# Patient Record
Sex: Female | Born: 1950 | Race: Black or African American | Hispanic: No | Marital: Married | Smoking: Never smoker
Health system: Southern US, Community
[De-identification: ages and names within clinical notes are randomized; demographics above are authoritative.]

## PROBLEM LIST (undated history)

## (undated) DIAGNOSIS — I951 Orthostatic hypotension: Secondary | ICD-10-CM

## (undated) DIAGNOSIS — I498 Other specified cardiac arrhythmias: Secondary | ICD-10-CM

## (undated) DIAGNOSIS — K219 Gastro-esophageal reflux disease without esophagitis: Secondary | ICD-10-CM

## (undated) DIAGNOSIS — R000 Tachycardia, unspecified: Secondary | ICD-10-CM

## (undated) DIAGNOSIS — D649 Anemia, unspecified: Secondary | ICD-10-CM

## (undated) DIAGNOSIS — R519 Headache, unspecified: Secondary | ICD-10-CM

## (undated) DIAGNOSIS — I10 Essential (primary) hypertension: Secondary | ICD-10-CM

## (undated) DIAGNOSIS — G90A Postural orthostatic tachycardia syndrome (POTS): Secondary | ICD-10-CM

## (undated) HISTORY — DX: Gastro-esophageal reflux disease without esophagitis: K21.9

## (undated) HISTORY — DX: Other specified cardiac arrhythmias: I49.8

## (undated) HISTORY — DX: Tachycardia, unspecified: R00.0

## (undated) HISTORY — DX: Orthostatic hypotension: I95.1

## (undated) HISTORY — PX: UTERINE FIBROID SURGERY: SHX826

## (undated) HISTORY — DX: Postural orthostatic tachycardia syndrome (POTS): G90.A

## (undated) HISTORY — DX: Headache, unspecified: R51.9

## (undated) HISTORY — PX: TUBAL LIGATION: SHX77

## (undated) HISTORY — DX: Anemia, unspecified: D64.9

## (undated) HISTORY — DX: Essential (primary) hypertension: I10

---

## 1996-01-18 HISTORY — PX: APPENDECTOMY: SHX54

## 1997-04-17 ENCOUNTER — Inpatient Hospital Stay (HOSPITAL_COMMUNITY): Admission: AD | Admit: 1997-04-17 | Discharge: 1997-04-20 | Payer: Self-pay | Admitting: Internal Medicine

## 1999-01-28 ENCOUNTER — Other Ambulatory Visit: Admission: RE | Admit: 1999-01-28 | Discharge: 1999-01-28 | Payer: Self-pay | Admitting: Obstetrics & Gynecology

## 2000-04-06 ENCOUNTER — Other Ambulatory Visit: Admission: RE | Admit: 2000-04-06 | Discharge: 2000-04-06 | Payer: Self-pay | Admitting: Obstetrics & Gynecology

## 2000-08-21 ENCOUNTER — Other Ambulatory Visit: Admission: RE | Admit: 2000-08-21 | Discharge: 2000-08-21 | Payer: Self-pay | Admitting: Obstetrics & Gynecology

## 2000-08-31 ENCOUNTER — Ambulatory Visit (HOSPITAL_COMMUNITY): Admission: RE | Admit: 2000-08-31 | Discharge: 2000-08-31 | Payer: Self-pay | Admitting: Obstetrics & Gynecology

## 2001-08-21 ENCOUNTER — Ambulatory Visit (HOSPITAL_COMMUNITY): Admission: RE | Admit: 2001-08-21 | Discharge: 2001-08-21 | Payer: Self-pay | Admitting: Internal Medicine

## 2001-08-21 ENCOUNTER — Encounter: Payer: Self-pay | Admitting: Internal Medicine

## 2001-09-13 ENCOUNTER — Other Ambulatory Visit: Admission: RE | Admit: 2001-09-13 | Discharge: 2001-09-13 | Payer: Self-pay | Admitting: Obstetrics & Gynecology

## 2002-06-18 ENCOUNTER — Encounter: Payer: Self-pay | Admitting: Internal Medicine

## 2002-06-18 ENCOUNTER — Encounter: Admission: RE | Admit: 2002-06-18 | Discharge: 2002-06-18 | Payer: Self-pay | Admitting: Internal Medicine

## 2003-01-21 ENCOUNTER — Other Ambulatory Visit: Admission: RE | Admit: 2003-01-21 | Discharge: 2003-01-21 | Payer: Self-pay | Admitting: Obstetrics & Gynecology

## 2003-07-10 ENCOUNTER — Ambulatory Visit (HOSPITAL_COMMUNITY): Admission: RE | Admit: 2003-07-10 | Discharge: 2003-07-10 | Payer: Self-pay | Admitting: Surgery

## 2003-07-10 ENCOUNTER — Encounter (INDEPENDENT_AMBULATORY_CARE_PROVIDER_SITE_OTHER): Payer: Self-pay | Admitting: Specialist

## 2003-07-10 ENCOUNTER — Ambulatory Visit (HOSPITAL_BASED_OUTPATIENT_CLINIC_OR_DEPARTMENT_OTHER): Admission: RE | Admit: 2003-07-10 | Discharge: 2003-07-10 | Payer: Self-pay | Admitting: Surgery

## 2004-02-18 ENCOUNTER — Other Ambulatory Visit: Admission: RE | Admit: 2004-02-18 | Discharge: 2004-02-18 | Payer: Self-pay | Admitting: Obstetrics & Gynecology

## 2005-02-24 ENCOUNTER — Other Ambulatory Visit: Admission: RE | Admit: 2005-02-24 | Discharge: 2005-02-24 | Payer: Self-pay | Admitting: Obstetrics & Gynecology

## 2006-04-17 ENCOUNTER — Emergency Department (HOSPITAL_COMMUNITY): Admission: EM | Admit: 2006-04-17 | Discharge: 2006-04-17 | Payer: Self-pay | Admitting: Family Medicine

## 2009-03-14 ENCOUNTER — Observation Stay (HOSPITAL_COMMUNITY): Admission: EM | Admit: 2009-03-14 | Discharge: 2009-03-15 | Payer: Self-pay | Admitting: Emergency Medicine

## 2009-03-14 ENCOUNTER — Encounter (INDEPENDENT_AMBULATORY_CARE_PROVIDER_SITE_OTHER): Payer: Self-pay | Admitting: Internal Medicine

## 2009-03-14 ENCOUNTER — Ambulatory Visit: Payer: Self-pay | Admitting: Internal Medicine

## 2009-03-16 ENCOUNTER — Telehealth: Payer: Self-pay | Admitting: Internal Medicine

## 2009-04-20 ENCOUNTER — Ambulatory Visit: Payer: Self-pay | Admitting: Internal Medicine

## 2009-04-20 DIAGNOSIS — R9431 Abnormal electrocardiogram [ECG] [EKG]: Secondary | ICD-10-CM

## 2009-04-20 DIAGNOSIS — R609 Edema, unspecified: Secondary | ICD-10-CM

## 2009-04-20 DIAGNOSIS — R000 Tachycardia, unspecified: Secondary | ICD-10-CM

## 2009-04-20 DIAGNOSIS — I10 Essential (primary) hypertension: Secondary | ICD-10-CM

## 2009-05-06 ENCOUNTER — Encounter (INDEPENDENT_AMBULATORY_CARE_PROVIDER_SITE_OTHER): Payer: Self-pay | Admitting: *Deleted

## 2009-05-06 ENCOUNTER — Ambulatory Visit: Payer: Self-pay | Admitting: Cardiology

## 2009-05-06 ENCOUNTER — Ambulatory Visit: Payer: Self-pay

## 2009-05-06 ENCOUNTER — Encounter: Payer: Self-pay | Admitting: Internal Medicine

## 2009-05-06 ENCOUNTER — Ambulatory Visit (HOSPITAL_COMMUNITY): Admission: RE | Admit: 2009-05-06 | Discharge: 2009-05-06 | Payer: Self-pay | Admitting: Internal Medicine

## 2009-05-07 ENCOUNTER — Telehealth: Payer: Self-pay | Admitting: Internal Medicine

## 2009-05-21 ENCOUNTER — Ambulatory Visit: Payer: Self-pay | Admitting: Internal Medicine

## 2009-05-26 ENCOUNTER — Telehealth (INDEPENDENT_AMBULATORY_CARE_PROVIDER_SITE_OTHER): Payer: Self-pay | Admitting: *Deleted

## 2009-05-26 ENCOUNTER — Encounter: Payer: Self-pay | Admitting: Internal Medicine

## 2009-05-30 ENCOUNTER — Emergency Department (HOSPITAL_BASED_OUTPATIENT_CLINIC_OR_DEPARTMENT_OTHER): Admission: EM | Admit: 2009-05-30 | Discharge: 2009-05-30 | Payer: Self-pay | Admitting: Emergency Medicine

## 2009-05-31 ENCOUNTER — Emergency Department (HOSPITAL_COMMUNITY): Admission: EM | Admit: 2009-05-31 | Discharge: 2009-05-31 | Payer: Self-pay | Admitting: Infectious Diseases

## 2009-05-31 ENCOUNTER — Ambulatory Visit: Payer: Self-pay | Admitting: Diagnostic Radiology

## 2009-05-31 ENCOUNTER — Emergency Department (HOSPITAL_BASED_OUTPATIENT_CLINIC_OR_DEPARTMENT_OTHER): Admission: EM | Admit: 2009-05-31 | Discharge: 2009-05-31 | Payer: Self-pay | Admitting: Emergency Medicine

## 2009-06-03 ENCOUNTER — Ambulatory Visit (HOSPITAL_BASED_OUTPATIENT_CLINIC_OR_DEPARTMENT_OTHER): Admission: RE | Admit: 2009-06-03 | Discharge: 2009-06-03 | Payer: Self-pay | Admitting: Emergency Medicine

## 2009-06-03 ENCOUNTER — Ambulatory Visit: Payer: Self-pay | Admitting: Diagnostic Radiology

## 2009-07-02 ENCOUNTER — Telehealth (INDEPENDENT_AMBULATORY_CARE_PROVIDER_SITE_OTHER): Payer: Self-pay | Admitting: *Deleted

## 2009-12-03 ENCOUNTER — Telehealth: Payer: Self-pay | Admitting: Internal Medicine

## 2010-02-07 ENCOUNTER — Encounter: Payer: Self-pay | Admitting: Obstetrics & Gynecology

## 2010-02-16 NOTE — Progress Notes (Signed)
Summary: surgical clearance  Phone Note Call from Patient Call back at [phone]   Caller: Patient Reason for Call: Talk to Nurse Summary of Call: needs letter faxed to Dr Whitman [facility], stating pt is cleared for wrist surgery. Please fax to Venia Minks [phone] Initial call taken by: Migdalia Dk,  May 26, 2009 11:46 AM  Follow-up for Phone Call        Per Dr Odessa Fleming note, patient cleared for surgery, letter faxed. Gypsy Balsam RN BSN  May 26, 2009 11:59 AM

## 2010-02-16 NOTE — Progress Notes (Signed)
Summary: echo results  Phone Note Outgoing Call Call back at [facility] Phone [phone]   Call placed by: Gypsy Balsam RN BSN,  May 07, 2009 6:11 PM Summary of Call: Called patient and left message on machine to call about echo results. Gypsy Balsam RN BSN  May 07, 2009 6:11 PM   Follow-up for Phone Call        returning call, Migdalia Dk  May 11, 2009 8:12 AM   Additional Follow-up for Phone Call Additional follow up Details #1::        Pt notified. Gypsy Balsam RN BSN  May 11, 2009 10:15 AM

## 2010-02-16 NOTE — Letter (Signed)
Summary: Clearance Letter  [employer], Main Office  [address]   [place], [place] [address]   Phone: [phone]  Fax: [phone]    May 26, 2009  Re:     Barbara Callahan Address:   [address]     [place], [place]  [address] DOB:     [birth year] MRN:     [id number]     Patient is at acceptable cardiac risk for wrist surgery.  Please call with questions.         Sincerely,  Sherryl Manges, MD, [facility] Gypsy Balsam RN BSN

## 2010-02-16 NOTE — Progress Notes (Signed)
Summary: [facility]  Phone Note Call from Patient Call back at [facility] Phone [phone]   Caller: Patient [phone] Summary of Call: PT ON A BETA BLOCKER-CAN SHE TAKE ZUMBA CLASSES? Initial call taken by: Glynda Jaeger,  July 02, 2009 3:43 PM  Follow-up for Phone Call        OK for zumba class, pt aware Follow-up by: Charolotte Capuchin, RN,  July 02, 2009 3:53 PM

## 2010-02-16 NOTE — Assessment & Plan Note (Signed)
Summary: appt @ 11:00/eph   History of Present Illness: Barbara Callahan is seen following hospitalizations for chest pain tachycardia palpitations. It is my presumption at that time that she probably had postural orthostatic tachycardia i.e. POTS because he had significant change in her heart is in the 80s to the 120s with standing that resolved with lying down. Her evaluation for secondary tachycardia included TSH that was borderline normal low, a CBC that was normal and cardiac evaluation done at [facility] that apparently included a normal echo and a normal stress test. She also been on a diuretic which he discontinued; amlodipine for her blood pressure issues was maintained.  She comes in today with worsening peripheral edema. She's also had problems with orthostatic stress intolerance i.e. dizziness and shortness of breath  Current Medications (verified): 1)  Metoprolol Succinate 100 Mg Xr24h-Tab (Metoprolol Succinate) .... Take One Tablet By Mouth Daily 2)  Aciphex 20 Mg Tbec (Rabeprazole Sodium) .... Take One Tablet Once Daily 3)  Ferrous Sulfate 325 (65 Fe) Mg Tabs (Ferrous Sulfate) .... Once Daily 4)  Cyclobenzaprine Hcl 10 Mg Tabs (Cyclobenzaprine Hcl) .... Take One Tablet As Needed Once Daily  Allergies (verified): No Known Drug Allergies  Past History:  Past Medical History: Last updated: May 01, 2009 Chest pain.  Atypical.  Noncardiac. Hypertension Gastroesophageal reflux disease Anemia Hypokalemia New diagnosis postural tachycardia syndrome, POTS  Past Surgical History: Last updated: May 01, 2009 Tubal ligation Appendectomy  Family History: Last updated: 05/01/09 Father died of a heart attack in his 54s and was a  nonsmoker.   Mother died at age 59 of breast cancer and was a nonsmoker.  Grandmother had pancreatic cancer, mother's side.   She has 2 brothers and 3 sisters all of whom are healthy.      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  Alcohol Use - no  Vital Signs:  Patient profile:   60 year old female Height:      63 inches Weight:      160 pounds BMI:     28.45 Pulse rate:   121 / minute Pulse (ortho):   119 / minute Pulse rhythm:   regular BP sitting:   146 / 90  (left arm) BP standing:   148 / 84 Cuff size:   large  Vitals Entered By: Judithe Modest CMA (April 20, 2009 11:11 AM)  Serial Vital Signs/Assessments:  Time      Position  BP       Pulse  Resp  Temp     By 11:16 AM  Lying RA  125/82   110                   Amanda Trulove CMA 11:16 AM  Sitting   134/89   115                   Amanda Trulove CMA 11:16 AM  Standing  148/84   119                   Amanda Trulove CMA  Comments: 11:16 AM 2 minutes-125/86 HR 118 5 minutes-139/83 HR 121  Pt reports dizziness and loss of balance.  Pt also reporting hot flashes and blurry vision.   By: Judithe Modest CMA    Physical Exam  General:  The patient was alert and oriented in no acute distress. HEENT Normal.  Neck veins were flat, carotids were brisk.  Lungs were  clear.  Heart sounds were regular without murmurs or gallops.  Abdomen was soft with active bowel sounds. There is no clubbing cyanosis or edema. Skin Warm and dry    EKG  Procedure date:  04/20/2009  Findings:      sinus rhythm at 105 with the intervals 0.15/2008/0.33 Axis is 64 Biatrial enlargement  Impression & Recommendations:  Problem # 1:  UNSPECIFIED TACHYCARDIA (ICD-785.0) Barbara. Callahan has what appears to be sinus tachycardia. It could be atrial tachycardia although the origin would be quite close to the sinus node. The fact that she has an abnormal cardiogram suggestive of biatrial margin and makes me wonder whether or there may not be an underlying cardiac pathology giving rise to the tachycardia secondary fashion. To do this, I would suggest that we repeat her echo. She is agreeable. There may be a role for MRI scanning in the event  that the other information is not illuminating Orders: Echocardiogram (Echo)  Problem # 2:  EDEMA (ICD-782.3) the edema may be related to her salt retention that was worse since we stopped her diuretic or more hopefully as a consequence of her amlodipine. We will plan to stop her amlodipine and see how it does. She is advised to decrease her salt intake.  Problem # 3:  HYPERTENSION, BENIGN (ICD-401.1) given the drug adjustments above we will plan to begin her on Avapro. Will check her kidneys in 2 weeks. We'll also increase her metoprolol from 50-100 mg a day.  Problem # 4:  ABNORMAL ELECTROCARDIOGRAM (ICD-794.31) biatrial enlargement will be evaluated as above  Patient Instructions: 1)  Your physician has recommended you make the following change in your medication: Increase your Metoprolol to 100mg  daily.  Stop your Amlodipine.  Start Avapro 150mg  daily.  2)  Your physician has requested that you have an echocardiogram.  Echocardiography is a painless test that uses sound waves to create images of your heart. It provides your doctor with information about the size and shape of your heart and how well your heart's chambers and valves are working.  This procedure takes approximately one hour. There are no restrictions for this procedure. 3)  Your physician recommends that you return for lab work in: 2 weeks with echo (BMP dx 785.0) 4)  Your physician recommends that you schedule a follow-up appointment in: 1 month with Dr Graciela Husbands Prescriptions: AVAPRO 150 MG TABS (IRBESARTAN) Take 1 tablet by mouth once a day  #30 x 11   Entered by:   Optometrist BSN   Authorized by:   Nathen May, MD, [facility]   Signed by:   Gypsy Balsam RN BSN on 04/20/2009   Method used:   Electronically to        [facility] [phone]* (retail)       [address]       [place], [place]  [address]       Ph: [phone] or [phone]       Fax: [phone]   RxID:    [id number] METOPROLOL SUCCINATE 100 MG XR24H-TAB (METOPROLOL SUCCINATE) Take one tablet by mouth daily  #30 x 11   Entered by:   Optometrist BSN   Authorized by:   Nathen May, MD, [facility]   Signed by:   Gypsy Balsam RN BSN on 04/20/2009   Method used:   Electronically to        [facility] #  [id number]* (retail)       [address]       [place], [place]  [address]       Ph: [phone] or [phone]       Fax: [phone]   RxID:   [id number]

## 2010-02-16 NOTE — Progress Notes (Signed)
Summary: meds  Phone Note Call from Patient Call back at Home Phone [phone]   Caller: Patient Summary of Call: pt was only given 3 atenolol 50mg  was told to take it for 2 weeks.Thinks that the script was written wrong. Please call pharmacy for the rest of the script. [facility] [phone] Initial call taken by: Edman Circle,  March 16, 2009 12:46 PM  Follow-up for Phone Call        RX written by Bailey Mech at hospital d/c. She was to do the beta blocker challenge per Dr. Graciela Husbands. RX was only written for 3 pills instead of 30 tabs. I verified this by E-Chart and new RX was sent to pharmacy.  Follow-up by: Duncan Dull, RN, BSN,  March 16, 2009 2:21 PM    New/Updated Medications: ATENOLOL 50 MG TABS (ATENOLOL) once daily Prescriptions: ATENOLOL 50 MG TABS (ATENOLOL) once daily  #30 x 0   Entered by:   Duncan Dull, RN, BSN   Authorized by:   Nathen May, MD, [facility]   Signed by:   Duncan Dull, RN, BSN on 03/16/2009   Method used:   Electronically to        [facility] # [phone]* (retail)       [address] [place], [place]  [address]       Ph: [phone]       Fax: [phone]   RxID:   [id number]

## 2010-02-16 NOTE — Letter (Signed)
Summary: [facility] - Outpatient Coins[facility] Notice  [facility] - Outpatient Coins[facility] Notice   Imported By: Marylou Mccoy 05/07/2009 12:31:14  _____________________________________________________________________  External Attachment:    Type:   Image     Comment:   External Document

## 2010-02-16 NOTE — Progress Notes (Signed)
Summary: c/o poor circulation in legs (no answer @ 12pm)  Phone Note Call from Patient Call back at [facility] Phone [phone]   Caller: Patient [phone] Reason for Call: Talk to Nurse Details for Reason: c/o poor circulation in legs.  Initial call taken by: Lorne Skeens,  December 03, 2009 11:51 AM  Follow-up for Phone Call        I attempted to call the pt at the contact # that she left. Someone answered that it was a nutrition center and they attempted to get the pt to the phone. When attempting to be transferred, the phone rang continuosly with no answer. I will attempt to call back later. Sherri Rad, RN, BSN  December 03, 2009 12:00P  Pt return call  [phone] Barbara Callahan  December 03, 2009 3:33 PM  Additional Follow-up for Phone Call Additional follow up Details #1::        talked with pt--pt states she has a constant ache  in her left leg from her thigh down to her toes--she states it started about 1 week ago, she denies any numbness in her feet or toes,she denies any edema, pt denies any injury to her leg--she states that it is a generalized ache and not a local pain--pt to call her PCP for followup and recommendations

## 2010-02-16 NOTE — Assessment & Plan Note (Signed)
Summary: 1 month rov/sl   Visit Type:  Follow-up   History of Present Illness: Barbara Callahan is seen following hospitalizations for chest pain tachycardia palpitations. It is my presumption at that time that she probably had postural orthostatic tachycardia i.e. POTS because he had significant change in her heart is in the 80s to the 120s with standing that resolved with lying down. Her evaluation for secondary tachycardia included TSH that was borderline normal low, a CBC that was normal and cardiac evaluation done at [facility] that apparently included a normal echo and a normal stress test.   Pravachol was recently repeated at her office that was normal.  We saw her last time we increased her beta blocker which is helped with her palpitations significantly. She did not take the ARB because she was concerned about kidney issues.  She is expected to have hand surgery.     Current Medications (verified): 1)  Metoprolol Succinate 100 Mg Xr24h-Tab (Metoprolol Succinate) .... Take One Tablet By Mouth Daily 2)  Aciphex 20 Mg Tbec (Rabeprazole Sodium) .... Take One Tablet Once Daily 3)  Ferrous Sulfate 325 (65 Fe) Mg Tabs (Ferrous Sulfate) .... Once Daily 4)  Cyclobenzaprine Hcl 10 Mg Tabs (Cyclobenzaprine Hcl) .... Take One Tablet As Needed Once Daily  Allergies (verified): No Known Drug Allergies  Past History:  Past Medical History: Last updated: 04/24/2009 Chest pain.  Atypical.  Noncardiac. Hypertension Gastroesophageal reflux disease Anemia Hypokalemia New diagnosis postural tachycardia syndrome, POTS  Past Surgical History: Last updated: 24-Apr-2009 Tubal ligation Appendectomy  Family History: Last updated: 04-24-2009 Father died of a heart attack in his 58s and was a  nonsmoker.   Mother died at age 59 of breast cancer and was a nonsmoker.  Grandmother had pancreatic cancer, mother's side.   She has 2 brothers and 3 sisters all of whom are healthy.      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  Alcohol Use - no  Vital Signs:  Patient profile:   60 year old female Height:      63 inches Weight:      162 pounds BMI:     28.80 Pulse rate:   86 / minute BP sitting:   150 / 86  (left arm)  Vitals Entered By: Laurance Flatten CMA (May 21, 2009 2:26 PM)  Physical Exam  General:  The patient was alert and oriented in no acute distress. HEENT Normal.  Neck veins were flat, carotids were brisk.  Lungs were clear.  Heart sounds were regular without murmurs or gallops.  Abdomen was soft with active bowel sounds. There is no clubbing cyanosis or edema. she is wearing a right wrist splint Skin Warm and dry    Impression & Recommendations:  Problem # 1:  HYPERTENSION, BENIGN (ICD-401.1) her blood pressure remains elevated. We had a long discussion about the role of Avapro. We discussed the alternative use of amlodipine which we have had to stop because of edema.  The plan will be for her to began at about 2-3 weeks prior to her preoperative blood work so that we can make sure thap wer kidney function is stable. She will followup with her primary care physician. The following medications were removed from the medication list:    Avapro 150 Mg Tabs (Irbesartan) .Marland Kitchen... Take 1 tablet by mouth once a day Her updated medication list for this problem includes:    Metoprolol Succinate 100 Mg Xr24h-tab (Metoprolol  succinate) .Marland Kitchen... Take one tablet by mouth daily  Problem # 2:  UNSPECIFIED TACHYCARDIA (ICD-785.0) her palpitations and tachycardia are much improved her heart rate is 85 on the higher dose of metoprolol which she is tolerating without apparent side effects  Problem # 3:  PREOPERATIVE EXAMINATION (ICD-V72.84) Based upon her violation here to date she is cleared to have her surgery for her wrist.  Patient Instructions: 1)  Your physician recommends that you schedule a follow-up appointment  as needed

## 2010-03-08 ENCOUNTER — Telehealth: Payer: Self-pay | Admitting: Internal Medicine

## 2010-03-16 NOTE — Progress Notes (Signed)
Summary: rx refill  Phone Note Refill Request Call back at [phone] Message from:  Patient on March 08, 2010 9:06 AM  Refills Requested: Medication #1:  METOPROLOL SUCCINATE 100 MG XR24H-TAB Take one tablet by mouth daily pt wants to know if she can take this at night.   Method Requested: Telephone to Pharmacy Initial call taken by: Roe Coombs,  March 08, 2010 9:06 AM  Follow-up for Phone Call       Follow-up by: Judithe Modest CMA,  March 08, 2010 4:19 PM    Prescriptions: METOPROLOL SUCCINATE 100 MG XR24H-TAB (METOPROLOL SUCCINATE) Take one tablet by mouth daily  #30 x 11   Entered by:   Judithe Modest CMA   Authorized by:   Nathen May, MD, [facility]   Signed by:   Judithe Modest CMA on 03/08/2010   Method used:   Electronically to        [facility] # [phone]* (retail)       [address] [place], [place]  [address]       Ph: [phone]       Fax: [phone]   RxID:   [id number]

## 2010-04-05 LAB — CBC
HCT: 34.2 % — ABNORMAL LOW (ref 36.0–46.0)
Hemoglobin: 11.5 g/dL — ABNORMAL LOW (ref 12.0–15.0)
MCV: 85.1 fL (ref 78.0–100.0)
RBC: 4.02 MIL/uL (ref 3.87–5.11)
RDW: 13.1 % (ref 11.5–15.5)
WBC: 7.6 10*3/uL (ref 4.0–10.5)

## 2010-04-05 LAB — BASIC METABOLIC PANEL
BUN: 13 mg/dL (ref 6–23)
CO2: 26 mEq/L (ref 19–32)
Calcium: 9 mg/dL (ref 8.4–10.5)
Chloride: 106 mEq/L (ref 96–112)
GFR calc Af Amer: >60 mL/min (ref >60)
GFR calc non Af Amer: >60 mL/min (ref >60)
Potassium: 3.6 mEq/L (ref 3.5–5.1)
Sodium: 140 mEq/L (ref 135–145)

## 2010-04-05 LAB — DIFFERENTIAL
Basophils Relative: 1 % (ref 0–1)
Eosinophils Absolute: 0 10*3/uL (ref 0.0–0.7)
Eosinophils Relative: 1 % (ref 0–5)
Lymphs Abs: 1.7 10*3/uL (ref 0.7–4.0)
Monocytes Absolute: 0.6 10*3/uL (ref 0.1–1.0)
Monocytes Relative: 8 % (ref 3–12)

## 2010-04-09 LAB — URINE MICROSCOPIC-ADD ON

## 2010-04-09 LAB — CBC
HCT: 37.5 % (ref 36.0–46.0)
HCT: 38.1 % (ref 36.0–46.0)
Hemoglobin: 12.3 g/dL (ref 12.0–15.0)
Hemoglobin: 12.6 g/dL (ref 12.0–15.0)
MCHC: 32.7 g/dL (ref 30.0–36.0)
MCV: 84.9 fL (ref 78.0–100.0)
Platelets: 253 10*3/uL (ref 150–400)
Platelets: 264 10*3/uL (ref 150–400)
RDW: 13.6 % (ref 11.5–15.5)
RDW: 14 % (ref 11.5–15.5)

## 2010-04-09 LAB — URINALYSIS, ROUTINE W REFLEX MICROSCOPIC
Bilirubin Urine: NEGATIVE
Glucose, UA: NEGATIVE mg/dL
Ketones, ur: NEGATIVE mg/dL
Protein, ur: NEGATIVE mg/dL
pH: 7 (ref 5.0–8.0)

## 2010-04-09 LAB — TROPONIN I: Troponin I: 0.03 ng/mL (ref 0.00–0.06)

## 2010-04-09 LAB — BASIC METABOLIC PANEL
BUN: 13 mg/dL (ref 6–23)
BUN: 13 mg/dL (ref 6–23)
CO2: 26 mEq/L (ref 19–32)
Calcium: 9.2 mg/dL (ref 8.4–10.5)
Chloride: 97 mEq/L (ref 96–112)
Glucose, Bld: 115 mg/dL — ABNORMAL HIGH (ref 70–99)
Glucose, Bld: 115 mg/dL — ABNORMAL HIGH (ref 70–99)
Potassium: 3.4 mEq/L — ABNORMAL LOW (ref 3.5–5.1)
Potassium: 3.5 mEq/L (ref 3.5–5.1)
Sodium: 134 mEq/L — ABNORMAL LOW (ref 135–145)
Sodium: 134 mEq/L — ABNORMAL LOW (ref 135–145)

## 2010-04-09 LAB — COMPREHENSIVE METABOLIC PANEL
ALT: 59 U/L — ABNORMAL HIGH (ref 0–35)
AST: 29 U/L (ref 0–37)
Albumin: 3.8 g/dL (ref 3.5–5.2)
Alkaline Phosphatase: 89 U/L (ref 39–117)
Calcium: 9.3 mg/dL (ref 8.4–10.5)
GFR calc Af Amer: >60 mL/min (ref >60)
Glucose, Bld: 117 mg/dL — ABNORMAL HIGH (ref 70–99)
Potassium: 3.5 mEq/L (ref 3.5–5.1)
Sodium: 139 mEq/L (ref 135–145)
Total Protein: 7.5 g/dL (ref 6.0–8.3)

## 2010-04-09 LAB — TSH
TSH: 0.526 u[IU]/mL (ref 0.350–4.500)
TSH: 0.644 u[IU]/mL (ref 0.350–4.500)

## 2010-04-09 LAB — POCT CARDIAC MARKERS
CKMB, poc: <1 ng/mL — ABNORMAL LOW (ref 1.0–8.0)
Myoglobin, poc: 49.9 ng/mL (ref 12–200)
Troponin i, poc: <0.05 ng/mL (ref 0.00–0.09)

## 2010-04-09 LAB — DIFFERENTIAL
Basophils Absolute: 0 10*3/uL (ref 0.0–0.1)
Basophils Relative: 0 % (ref 0–1)
Eosinophils Absolute: 0.2 10*3/uL (ref 0.0–0.7)
Eosinophils Absolute: 0.2 10*3/uL (ref 0.0–0.7)
Eosinophils Relative: 3 % (ref 0–5)
Eosinophils Relative: 3 % (ref 0–5)
Lymphocytes Relative: 36 % (ref 12–46)
Lymphs Abs: 2.5 10*3/uL (ref 0.7–4.0)
Monocytes Absolute: 0.6 10*3/uL (ref 0.1–1.0)
Monocytes Absolute: 0.8 10*3/uL (ref 0.1–1.0)

## 2010-04-09 LAB — LIPID PANEL
Triglycerides: 56 mg/dL (ref <150)
VLDL: 11 mg/dL (ref 0–40)

## 2010-04-09 LAB — CARDIAC PANEL(CRET KIN+CKTOT+MB+TROPI)
Relative Index: INVALID (ref 0.0–2.5)
Total CK: 68 U/L (ref 7–177)
Troponin I: 0.02 ng/mL (ref 0.00–0.06)

## 2010-04-09 LAB — T4, FREE: Free T4: 1.47 ng/dL (ref 0.80–1.80)

## 2010-04-09 LAB — CK TOTAL AND CKMB (NOT AT ARMC)
CK, MB: 0.9 ng/mL (ref 0.3–4.0)
Relative Index: INVALID (ref 0.0–2.5)
Total CK: 59 U/L (ref 7–177)

## 2010-04-09 LAB — PROTIME-INR: Prothrombin Time: 12.3 seconds (ref 11.6–15.2)

## 2010-06-04 NOTE — Op Note (Signed)
NAMEMALEEYAH, Barbara Callahan                          ACCOUNT NO.:  [id number]   MEDICAL RECORD NO.:  [id number]                   PATIENT TYPE:  AMB   LOCATION:  DSC                                  FACILITY:  [facility]   PHYSICIAN:  Sandria Bales. Ezzard Standing, M.D.               DATE OF BIRTH:  March 16, 1950   DATE OF PROCEDURE:  07/10/2003  DATE OF DISCHARGE:                                 OPERATIVE REPORT   PREOPERATIVE DIAGNOSIS:  4 cm mass left anterior abdominal wall.   POSTOPERATIVE DIAGNOSIS:  4 cm mass left anterior abdominal wall, pathology  pending.   PROCEDURE:  Excision of left abdominal mass.   SURGEON:  Sandria Bales. Ezzard Standing, M.D.   ASSISTANT:  None.   ANESTHESIA:  Approximately 25 mL of 1% Xylocaine with epinephrine.   COMPLICATIONS:  None.   INDICATIONS FOR PROCEDURE:  [name] is a 60 year old white female who has  had an increasing mass over her left abdominal wall and now comes in for  excision of this mass.   DESCRIPTION OF PROCEDURE:  The patient was placed in a supine position.  Her  abdomen was prepped with Betadine solution and sterilely draped.  A total of  about 25 to 26 mL of 1% Xylocaine was used.  An elliptical incision was made  excising the skin over the cyst which looked like a sebaceous cyst though it  actually had clear fluid in it, the entire cyst cavity was excised and sent  to pathology.  Hemostasis was controlled with 3-0 Vicryl sutures.  The  subcutaneous tissues were closed with 3-0 Vicryl suture and the skin with a  5-0 Vicryl subcuticular running suture.  The wound was painted with tincture  of Benzoin and had Steri-Strips placed.  The patient tolerated the procedure  well.  She will be discharged to home today, this was done as an outpatient  at the [facility].  She will be seen back in two weeks for a wound  check.                                               Sandria Bales. Ezzard Standing, M.D.    DHN/MEDQ  D:  07/10/2003  T:  07/11/2003  Job:  [id number]   cc:    Corwin Levins, M.D. [facility]

## 2010-06-24 ENCOUNTER — Encounter: Payer: Self-pay | Admitting: Internal Medicine

## 2010-08-03 ENCOUNTER — Ambulatory Visit: Payer: Self-pay | Admitting: Internal Medicine

## 2011-03-25 ENCOUNTER — Ambulatory Visit: Payer: Self-pay | Admitting: Internal Medicine

## 2011-05-25 ENCOUNTER — Emergency Department (HOSPITAL_BASED_OUTPATIENT_CLINIC_OR_DEPARTMENT_OTHER)
Admission: EM | Admit: 2011-05-25 | Discharge: 2011-05-25 | Disposition: A | Discharge status: Home / Self Care | Payer: 59 | Attending: Emergency Medicine | Admitting: Emergency Medicine

## 2011-05-25 ENCOUNTER — Emergency Department (INDEPENDENT_AMBULATORY_CARE_PROVIDER_SITE_OTHER): Payer: 59

## 2011-05-25 ENCOUNTER — Encounter (HOSPITAL_BASED_OUTPATIENT_CLINIC_OR_DEPARTMENT_OTHER): Payer: Self-pay | Admitting: Emergency Medicine

## 2011-05-25 DIAGNOSIS — N9489 Other specified conditions associated with female genital organs and menstrual cycle: Secondary | ICD-10-CM

## 2011-05-25 DIAGNOSIS — D259 Leiomyoma of uterus, unspecified: Secondary | ICD-10-CM | POA: Insufficient documentation

## 2011-05-25 DIAGNOSIS — I10 Essential (primary) hypertension: Secondary | ICD-10-CM | POA: Insufficient documentation

## 2011-05-25 DIAGNOSIS — R109 Unspecified abdominal pain: Secondary | ICD-10-CM | POA: Insufficient documentation

## 2011-05-25 DIAGNOSIS — N939 Abnormal uterine and vaginal bleeding, unspecified: Secondary | ICD-10-CM

## 2011-05-25 DIAGNOSIS — N898 Other specified noninflammatory disorders of vagina: Secondary | ICD-10-CM | POA: Insufficient documentation

## 2011-05-25 DIAGNOSIS — D219 Benign neoplasm of connective and other soft tissue, unspecified: Secondary | ICD-10-CM

## 2011-05-25 DIAGNOSIS — D250 Submucous leiomyoma of uterus: Secondary | ICD-10-CM

## 2011-05-25 DIAGNOSIS — D251 Intramural leiomyoma of uterus: Secondary | ICD-10-CM

## 2011-05-25 LAB — DIFFERENTIAL
Basophils Absolute: 0 10*3/uL (ref 0.0–0.1)
Basophils Relative: 0 % (ref 0–1)
Eosinophils Absolute: 0.2 10*3/uL (ref 0.0–0.7)
Eosinophils Relative: 3 % (ref 0–5)
Lymphocytes Relative: 35 % (ref 12–46)
Monocytes Absolute: 0.9 10*3/uL (ref 0.1–1.0)

## 2011-05-25 LAB — CBC
HCT: 37.1 % (ref 36.0–46.0)
MCHC: 33.4 g/dL (ref 30.0–36.0)
MCV: 83.2 fL (ref 78.0–100.0)
Platelets: 244 10*3/uL (ref 150–400)
RDW: 13.5 % (ref 11.5–15.5)
WBC: 5.7 10*3/uL (ref 4.0–10.5)

## 2011-05-25 LAB — BASIC METABOLIC PANEL
Calcium: 9.5 mg/dL (ref 8.4–10.5)
Creatinine, Ser: 0.9 mg/dL (ref 0.50–1.10)
GFR calc Af Amer: 80 mL/min — ABNORMAL LOW (ref >90)
GFR calc non Af Amer: 69 mL/min — ABNORMAL LOW (ref >90)
Sodium: 139 mEq/L (ref 135–145)

## 2011-05-25 MED ORDER — METOPROLOL TARTRATE 1 MG/ML IV SOLN
10.0000 mg | Freq: Once | INTRAVENOUS | Status: AC
  Administered 2011-05-25: 10 mg via INTRAVENOUS
  Filled 2011-05-25: qty 10

## 2011-05-25 MED ORDER — SODIUM CHLORIDE 0.9 % IV BOLUS (SEPSIS)
1000.0000 mL | Freq: Once | INTRAVENOUS | Status: AC
  Administered 2011-05-25: 1000 mL via INTRAVENOUS

## 2011-05-25 NOTE — ED Notes (Signed)
Pt reports that she takes metoprolol at bedtime but did not take medication last pm

## 2011-05-25 NOTE — ED Provider Notes (Signed)
After treatment in the ED the patient feels back to baseline and wants to go home.   Patient has an appointment to see her gynecologist this coming Monday.  Nelia Shi, MD 05/25/11 [phone]

## 2011-05-25 NOTE — ED Notes (Signed)
Pt awoke to severe cramps, assumed it was her fibroids, went to bathroom and noted blood , originally thought she was peeing but noted blood in toilet and all over pants,

## 2011-05-25 NOTE — Discharge Instructions (Signed)
Fibroids Fibroids are lumps (tumors) that can occur any place in a woman's body. These lumps are not cancerous. Fibroids vary in size, weight, and where they grow. HOME CARE  Do not take aspirin.   Write down the number of pads or tampons you use during your period. Tell your doctor. This can help determine the best treatment for you.  GET HELP RIGHT AWAY IF:  You have pain in your lower belly (abdomen) that is not helped with medicine.   You have cramps that are not helped with medicine.   You have more bleeding between or during your period.   You feel lightheaded or pass out (faint).   Your lower belly pain gets worse.  MAKE SURE YOU:  Understand these instructions.   Will watch your condition.   Will get help right away if you are not doing well or get worse.  Document Released: 02/05/2010 Document Revised: 12/23/2010 Document Reviewed: 02/05/2010 [employer] Patient Information 2012 [employer]. 

## 2011-05-25 NOTE — ED Provider Notes (Signed)
History     CSN: [id number]  Arrival date & time 05/25/11  5284   First MD Initiated Contact with Patient 05/25/11 [phone]      Chief Complaint  Patient presents with  . Vaginal Bleeding    (Consider location/radiation/quality/duration/timing/severity/associated sxs/prior treatment) HPI This is a 61 year old black female with a history of fibroids. She awoke about 4:15 this morning with cramping  Similar cramping she has previously had due to her fibroids. She realized that her underwear was wet and what she thought was urine was in fact vaginal bleeding. She bled heavily into the toilet for about 2 minutes before the flow decreased. She has had milder bleeding since. She has not had any other vaginal bleeding since menopause. She is experiencing moderate cramping her uterus. She denies lightheadedness, chest pain or dyspnea. There no specific exacerbating or mitigating factors. She forgot to take her metoprolol last night at bedtime.  Past Medical History  Diagnosis Date  . Chest pain     aytipical, noncardiac  . Hypertension   . Gastroesophageal reflux disease   . Anemia   . Hypokalemia   . POTS (postural orthostatic tachycardia syndrome)   . Tachycardia     Past Surgical History  Procedure Date  . Tubal ligation   . Appendectomy     Family History  Problem Relation Age of Onset  . Heart attack Father 83  . Breast cancer Mother 53  . Pancreatic cancer Maternal Grandmother     History  Substance Use Topics  . Smoking status: Never Smoker   . Smokeless tobacco: Not on file  . Alcohol Use: No    OB History    Grav Para Term Preterm Abortions TAB SAB Ect Mult Living                  Review of Systems  All other systems reviewed and are negative.    Allergies  Review of patient's allergies indicates no known allergies.  Home Medications   Current Outpatient Rx  Name Route Sig Dispense Refill  . CYCLOBENZAPRINE HCL 10 MG PO TABS Oral Take 10 mg by mouth  daily as needed.      Marland Kitchen FERROUS SULFATE 325 (65 FE) MG PO TABS Oral Take 325 mg by mouth daily.      Marland Kitchen METOPROLOL SUCCINATE ER 100 MG PO TB24 Oral Take 100 mg by mouth daily.      Marland Kitchen RABEPRAZOLE SODIUM 20 MG PO TBEC Oral Take 20 mg by mouth daily.        BP 140/82  Pulse 97  Resp 16  SpO2 96%  Physical Exam General: Well-developed, well-nourished female in no acute distress; appearance consistent with age of record HENT: normocephalic, atraumatic Eyes: pupils equal round and reactive to light; extraocular muscles intact; no conjunctival pallor; arcus senilis bilaterally Neck: supple Heart: regular rate and rhythm; tachycardia Lungs: clear to auscultation bilaterally Abdomen: soft; nondistended; suprapubic tenderness; bowel sounds present GU: Normal external genitalia; vaginal bleeding Extremities: No deformity; full range of motion; pulses normal; trace edema of lower legs Neurologic: Awake, alert and oriented; motor function intact in all extremities and symmetric; no facial droop Skin: Warm and dry     ED Course  Procedures (including critical care time)     MDM   Nursing notes and vitals signs, including pulse oximetry, reviewed.  Summary of this visit's results, reviewed by myself:  Labs:  Results for orders placed during the hospital encounter of 05/25/11  CBC  Component Value Range   WBC 5.7  4.0 - 10.5 (K/uL)   RBC 4.46  3.87 - 5.11 (MIL/uL)   Hemoglobin 12.4  12.0 - 15.0 (g/dL)   HCT 57.8  46.9 - 62.9 (%)   MCV 83.2  78.0 - 100.0 (fL)   MCH 27.8  26.0 - 34.0 (pg)   MCHC 33.4  30.0 - 36.0 (g/dL)   RDW 52.8  41.3 - 24.4 (%)   Platelets 244  150 - 400 (K/uL)  DIFFERENTIAL      Component Value Range   Neutrophils Relative 47  43 - 77 (%)   Neutro Abs 2.7  1.7 - 7.7 (K/uL)   Lymphocytes Relative 35  12 - 46 (%)   Lymphs Abs 2.0  0.7 - 4.0 (K/uL)   Monocytes Relative 16 (*) 3 - 12 (%)   Monocytes Absolute 0.9  0.1 - 1.0 (K/uL)   Eosinophils Relative 3   0 - 5 (%)   Eosinophils Absolute 0.2  0.0 - 0.7 (K/uL)   Basophils Relative 0  0 - 1 (%)   Basophils Absolute 0.0  0.0 - 0.1 (K/uL)  BASIC METABOLIC PANEL      Component Value Range   Sodium 139  135 - 145 (mEq/L)   Potassium 3.5  3.5 - 5.1 (mEq/L)   Chloride 102  96 - 112 (mEq/L)   CO2 27  19 - 32 (mEq/L)   Glucose, Bld 118 (*) 70 - 99 (mg/dL)   BUN 12  6 - 23 (mg/dL)   Creatinine, Ser 0.10  0.50 - 1.10 (mg/dL)   Calcium 9.5  8.4 - 27.2 (mg/dL)   GFR calc non Af Amer 69 (*) >90 (mL/min)   GFR calc Af Amer 80 (*) >90 (mL/min)    Imaging Studies: No results found.  6:27 AM Tachycardia improved after IV metoprolol 10 mg. Patient states her vaginal bleeding has slowed down. Her cramping is improved. Ultrasound of pelvis pending.  7:02 AM Dr. Radford Pax will follow up on [place] results and make disposition.         Hanley Seamen, MD 05/25/11 [phone]

## 2011-05-25 NOTE — ED Notes (Signed)
Patient transported to Ultrasound 

## 2011-05-31 ENCOUNTER — Other Ambulatory Visit: Payer: Self-pay | Admitting: Internal Medicine

## 2011-06-01 ENCOUNTER — Other Ambulatory Visit: Payer: Self-pay | Admitting: Obstetrics and Gynecology

## 2011-06-02 ENCOUNTER — Encounter (HOSPITAL_COMMUNITY): Payer: Self-pay | Admitting: Pharmacist

## 2011-06-15 ENCOUNTER — Ambulatory Visit (HOSPITAL_COMMUNITY): Payer: 59 | Admitting: Anesthesiology

## 2011-06-15 ENCOUNTER — Encounter (HOSPITAL_COMMUNITY): Payer: Self-pay | Admitting: Anesthesiology

## 2011-06-15 ENCOUNTER — Ambulatory Visit (HOSPITAL_COMMUNITY)
Admission: RE | Admit: 2011-06-15 | Discharge: 2011-06-15 | Disposition: A | Discharge status: Home / Self Care | Payer: 59 | Source: Ambulatory Visit | Attending: Obstetrics and Gynecology | Admitting: Obstetrics and Gynecology

## 2011-06-15 ENCOUNTER — Encounter (HOSPITAL_COMMUNITY)
Admission: RE | Disposition: A | Discharge status: Home / Self Care | Payer: Self-pay | Source: Ambulatory Visit | Attending: Obstetrics and Gynecology

## 2011-06-15 DIAGNOSIS — R609 Edema, unspecified: Secondary | ICD-10-CM

## 2011-06-15 DIAGNOSIS — R000 Tachycardia, unspecified: Secondary | ICD-10-CM

## 2011-06-15 DIAGNOSIS — D250 Submucous leiomyoma of uterus: Secondary | ICD-10-CM | POA: Insufficient documentation

## 2011-06-15 DIAGNOSIS — R9431 Abnormal electrocardiogram [ECG] [EKG]: Secondary | ICD-10-CM

## 2011-06-15 DIAGNOSIS — R9389 Abnormal findings on diagnostic imaging of other specified body structures: Secondary | ICD-10-CM | POA: Insufficient documentation

## 2011-06-15 DIAGNOSIS — N950 Postmenopausal bleeding: Secondary | ICD-10-CM

## 2011-06-15 DIAGNOSIS — I10 Essential (primary) hypertension: Secondary | ICD-10-CM

## 2011-06-15 LAB — COMPREHENSIVE METABOLIC PANEL
ALT: 26 U/L (ref 0–35)
AST: 22 U/L (ref 0–37)
Albumin: 4 g/dL (ref 3.5–5.2)
CO2: 29 mEq/L (ref 19–32)
Chloride: 98 mEq/L (ref 96–112)
Creatinine, Ser: 0.86 mg/dL (ref 0.50–1.10)
Potassium: 3.9 mEq/L (ref 3.5–5.1)
Sodium: 134 mEq/L — ABNORMAL LOW (ref 135–145)
Total Bilirubin: 0.4 mg/dL (ref 0.3–1.2)

## 2011-06-15 LAB — CBC
MCV: 86.2 fL (ref 78.0–100.0)
Platelets: 274 10*3/uL (ref 150–400)
RBC: 4.7 MIL/uL (ref 3.87–5.11)
RDW: 13.6 % (ref 11.5–15.5)
WBC: 5.1 10*3/uL (ref 4.0–10.5)

## 2011-06-15 SURGERY — DILATATION & CURETTAGE/HYSTEROSCOPY WITH RESECTOCOPE
Anesthesia: General | Site: Uterus | Wound class: Clean Contaminated

## 2011-06-15 MED ORDER — PROPOFOL 10 MG/ML IV EMUL
INTRAVENOUS | Status: AC
  Filled 2011-06-15: qty 20

## 2011-06-15 MED ORDER — ONDANSETRON HCL 4 MG/2ML IJ SOLN
INTRAMUSCULAR | Status: DC | PRN
  Administered 2011-06-15: 4 mg via INTRAVENOUS

## 2011-06-15 MED ORDER — CEFAZOLIN SODIUM 1-5 GM-% IV SOLN
INTRAVENOUS | Status: AC
  Filled 2011-06-15: qty 50

## 2011-06-15 MED ORDER — ONDANSETRON HCL 4 MG/2ML IJ SOLN
INTRAMUSCULAR | Status: AC
  Filled 2011-06-15: qty 2

## 2011-06-15 MED ORDER — GLYCINE 1.5 % IR SOLN
Status: DC | PRN
  Administered 2011-06-15: 9000 mL

## 2011-06-15 MED ORDER — LIDOCAINE HCL 1 % IJ SOLN
INTRAMUSCULAR | Status: DC | PRN
  Administered 2011-06-15: 10 mL

## 2011-06-15 MED ORDER — DEXAMETHASONE SODIUM PHOSPHATE 10 MG/ML IJ SOLN
INTRAMUSCULAR | Status: DC | PRN
  Administered 2011-06-15: 10 mg via INTRAVENOUS

## 2011-06-15 MED ORDER — LIDOCAINE HCL (CARDIAC) 20 MG/ML IV SOLN
INTRAVENOUS | Status: AC
  Filled 2011-06-15: qty 5

## 2011-06-15 MED ORDER — MEPERIDINE HCL 25 MG/ML IJ SOLN: 6.2500 mg | INTRAMUSCULAR | Status: DC | PRN

## 2011-06-15 MED ORDER — MIDAZOLAM HCL 2 MG/2ML IJ SOLN
INTRAMUSCULAR | Status: AC
  Filled 2011-06-15: qty 2

## 2011-06-15 MED ORDER — PROPOFOL 10 MG/ML IV EMUL
INTRAVENOUS | Status: DC | PRN
  Administered 2011-06-15: 180 mg via INTRAVENOUS

## 2011-06-15 MED ORDER — FENTANYL CITRATE 0.05 MG/ML IJ SOLN: 25.0000 ug | INTRAMUSCULAR | Status: DC | PRN

## 2011-06-15 MED ORDER — MIDAZOLAM HCL 5 MG/5ML IJ SOLN
INTRAMUSCULAR | Status: DC | PRN
  Administered 2011-06-15: 2 mg via INTRAVENOUS

## 2011-06-15 MED ORDER — FENTANYL CITRATE 0.05 MG/ML IJ SOLN
INTRAMUSCULAR | Status: DC | PRN
  Administered 2011-06-15: 100 ug via INTRAVENOUS

## 2011-06-15 MED ORDER — DEXAMETHASONE SODIUM PHOSPHATE 10 MG/ML IJ SOLN
INTRAMUSCULAR | Status: AC
  Filled 2011-06-15: qty 1

## 2011-06-15 MED ORDER — CEFAZOLIN SODIUM 1-5 GM-% IV SOLN
1.0000 g | INTRAVENOUS | Status: AC
  Administered 2011-06-15: 1 g via INTRAVENOUS

## 2011-06-15 MED ORDER — FENTANYL CITRATE 0.05 MG/ML IJ SOLN
INTRAMUSCULAR | Status: AC
  Filled 2011-06-15: qty 2

## 2011-06-15 MED ORDER — LIDOCAINE HCL (CARDIAC) 20 MG/ML IV SOLN
INTRAVENOUS | Status: DC | PRN
  Administered 2011-06-15: 50 mg via INTRAVENOUS

## 2011-06-15 MED ORDER — METOCLOPRAMIDE HCL 5 MG/ML IJ SOLN: 10.0000 mg | Freq: Once | INTRAMUSCULAR | Status: DC | PRN

## 2011-06-15 MED ORDER — LACTATED RINGERS IV SOLN
INTRAVENOUS | Status: DC
  Administered 2011-06-15 (×3): via INTRAVENOUS

## 2011-06-15 SURGICAL SUPPLY — 19 items
CANISTER SUCTION 2500CC (MISCELLANEOUS) ×2 IMPLANT
CATH ROBINSON RED A/P 16FR (CATHETERS) ×2 IMPLANT
CLOTH BEACON ORANGE TIMEOUT ST (SAFETY) ×2 IMPLANT
CONTAINER PREFILL 10% NBF 60ML (FORM) ×2 IMPLANT
DECANTER SPIKE VIAL GLASS SM (MISCELLANEOUS) ×2 IMPLANT
ELECT REM PT RETURN 9FT ADLT (ELECTROSURGICAL) ×2
ELECTRODE REM PT RTRN 9FT ADLT (ELECTROSURGICAL) ×1 IMPLANT
ELECTRODE VAPORCUT 22FR (ELECTROSURGICAL) ×2 IMPLANT
GLOVE BIOGEL PI IND STRL 6.5 (GLOVE) ×1 IMPLANT
GLOVE BIOGEL PI INDICATOR 6.5 (GLOVE) ×1
GLOVE ECLIPSE 6.0 STRL STRAW (GLOVE) ×4 IMPLANT
GLOVE ECLIPSE 7.0 STRL STRAW (GLOVE) ×4 IMPLANT
GOWN PREVENTION PLUS LG XLONG (DISPOSABLE) IMPLANT
GOWN PREVENTION PLUS XLARGE (GOWN DISPOSABLE) IMPLANT
GOWN STRL REIN XL XLG (GOWN DISPOSABLE) IMPLANT
LOOP ANGLED CUTTING 22FR (CUTTING LOOP) ×2 IMPLANT
PACK HYSTEROSCOPY LF (CUSTOM PROCEDURE TRAY) ×2 IMPLANT
TOWEL OR 17X24 6PK STRL BLUE (TOWEL DISPOSABLE) IMPLANT
WATER STERILE IRR 1000ML POUR (IV SOLUTION) ×2 IMPLANT

## 2011-06-15 NOTE — Transfer of Care (Signed)
Immediate Anesthesia Transfer of Care Note  Patient: Barbara Callahan  Procedure(s) Performed: Procedure(s) (LRB): DILATATION & CURETTAGE/HYSTEROSCOPY WITH RESECTOCOPE (N/A)  Patient Location: PACU  Anesthesia Type: General  Level of Consciousness: sedated  Airway & Oxygen Therapy: Patient Spontanous Breathing and Patient connected to nasal cannula oxygen  Post-op Assessment: Report given to PACU RN  Post vital signs: Reviewed and stable  Complications: No apparent anesthesia complications

## 2011-06-15 NOTE — Anesthesia Preprocedure Evaluation (Signed)
Anesthesia Evaluation  Patient identified by MRN, date of birth, ID band Patient awake    Reviewed: Allergy & Precautions, H&P , NPO status , Patient's Chart, lab work & pertinent test results  Airway Mallampati: II TM Distance: >3 FB Neck ROM: Full    Dental No notable dental hx. (+) Teeth Intact   Pulmonary neg pulmonary ROS,  breath sounds clear to auscultation  Pulmonary exam normal       Cardiovascular hypertension, Pt. on medications and Pt. on home beta blockers Rhythm:Regular Rate:Normal     Neuro/Psych negative neurological ROS  negative psych ROS   GI/Hepatic Neg liver ROS, GERD-  Medicated and Controlled,  Endo/Other  negative endocrine ROS  Renal/GU negative Renal ROS  negative genitourinary   Musculoskeletal negative musculoskeletal ROS (+)   Abdominal   Peds  Hematology negative hematology ROS (+)   Anesthesia Other Findings   Reproductive/Obstetrics negative OB ROS                           Anesthesia Physical Anesthesia Plan  ASA: II  Anesthesia Plan: General   Post-op Pain Management:    Induction: Intravenous  Airway Management Planned: LMA  Additional Equipment:   Intra-op Plan:   Post-operative Plan: Extubation in OR  Informed Consent: I have reviewed the patients History and Physical, chart, labs and discussed the procedure including the risks, benefits and alternatives for the proposed anesthesia with the patient or authorized representative who has indicated his/her understanding and acceptance.   Dental advisory given  Plan Discussed with: CRNA, Anesthesiologist and Surgeon  Anesthesia Plan Comments:         Anesthesia Quick Evaluation

## 2011-06-15 NOTE — Op Note (Signed)
Barbara Callahan, Barbara Callahan                ACCOUNT NO.:  [id number]  MEDICAL RECORD NO.:  [id number]  LOCATION:  [facility]                          FACILITY:  [facility]  PHYSICIAN:  Malva Limes, M.D.    DATE OF BIRTH:  July 10, 1950  DATE OF PROCEDURE: DATE OF DISCHARGE:  06/15/2011                              OPERATIVE REPORT   PREOPERATIVE DIAGNOSES: 1. Postmenopausal bleeding 2. Uterine fibroids.  POSTOPERATIVE DIAGNOSES: 1. Postmenopausal bleeding. 2. Uterine fibroids.  PROCEDURE: 1. Hysteroscopy. 2. Dilation and curettage 3. Hysteroscopic resection of submucous fibroids.  SURGEON:  Malva Limes, M.D.  ANESTHESIA:  General with paracervical block.  ANTIBIOTICS:  Ancef 1 g.  DRAINS:  Red rubber catheter to bladder.  SPECIMENS:  Endometrial curettings and fibroid resection sent to pathology.  COMPLICATIONS:  None.  ESTIMATED BLOOD LOSS:  20 mL.  PROCEDURE:  The patient was taken to the operating room, where general anesthetic was administered without difficulty.  She was then placed in dorsal lithotomy position.  She was prepped and draped in the usual fashion for this procedure.  The patient had been given Cytotec 200 mg the night before because she had a stenotic cervical os.  The cervix was grasped with a single-tooth tenaculum and serially dilated to a 21- [place].  The assisted hysteroscope was advanced through the endocervical canal which appeared to be normal.  On entering the uterine cavity, the patient appeared to have a rather atrophic endometrium.  There was a very small polyp just anterior to the left ostia.  There was a posterior submucous fibroid and a submucous fibroid on the left anterior wall.  At this point, hysteroscope was removed and sharp curettage was performed with a minimal amount of tissue being obtained.  Following this, the patient was serially dilated to a 29-French.  The resectoscope was advanced through the cervical canal and the posterior submucous  fibroid resection.  The anterior fibroid was then attempted to be resected but that was calcified and was very difficult to cut through.  A second thicker loop was used at 120 watts and despite this, there was still some difficulty removing this fibroid.  Approximately 70% of the fibroid was removed.  This concluded the procedure.  The patient tolerated the procedure well.  She was taken to recovery room in stable condition.  Fluid deficit was 500 mL.  The patient will be discharged to home.  She will follow up in the office in 4 weeks.  She will be sent home with Vicodin to take p.r.n.          ______________________________ Malva Limes, M.D.     MA/MEDQ  D:  06/15/2011  T:  06/15/2011  Job:  [id number]

## 2011-06-15 NOTE — Discharge Instructions (Signed)

## 2011-06-15 NOTE — Anesthesia Postprocedure Evaluation (Signed)
Anesthesia Post Note  Patient: Barbara Callahan  Procedure(s) Performed: Procedure(s) (LRB): DILATATION & CURETTAGE/HYSTEROSCOPY WITH RESECTOCOPE (N/A)  Anesthesia type: General  Patient location: PACU  Post pain: Pain level controlled  Post assessment: Post-op Vital signs reviewed  Last Vitals:  Filed Vitals:   06/15/11 1058  BP: 144/95  Pulse: 86  Temp: 36.7 C    Post vital signs: Reviewed  Level of consciousness: sedated  Complications: No apparent anesthesia complicationsfj

## 2011-06-15 NOTE — H&P (Signed)
Pt is a 61 year old black female who presents to the OR for hysteroscopy and D&C for postmenopausal bleeding and a thickened endometrium on ultrasound. Her strip on ultrasound was 9mm. Pt has known fibroids. PMHx: Pt has POTS, anesthesia aware. PE: overwt black female in NAD.        Abd- non tender, no masses/        Pelvic- ext-wnl                    Vagina-wnl                     Cx- nullip                     Uterus- 8 weeks, non tender IMP/Postmenopausal bleeding Plan/ Hysteroscopy, D&C

## 2011-07-05 ENCOUNTER — Other Ambulatory Visit: Payer: Self-pay | Admitting: Obstetrics and Gynecology

## 2011-07-18 ENCOUNTER — Telehealth: Payer: Self-pay | Admitting: Internal Medicine

## 2011-07-18 MED ORDER — METOPROLOL SUCCINATE ER 100 MG PO TB24: 100.0000 mg | ORAL_TABLET | Freq: Every day | ORAL | Status: DC

## 2011-07-18 NOTE — Telephone Encounter (Signed)
Refill sent, patient notified.  Vista Mink, CMA

## 2011-07-18 NOTE — Telephone Encounter (Signed)
New Problem:    Patient called in needinf a refill of her metoprolol (TOPROL-XL) 100 MG 24 hr tablet.  Please call back when the order has been placed.

## 2011-08-16 ENCOUNTER — Telehealth: Payer: Self-pay | Admitting: Internal Medicine

## 2011-08-16 ENCOUNTER — Encounter: Payer: Self-pay | Admitting: Physician Assistant

## 2011-08-16 ENCOUNTER — Ambulatory Visit (INDEPENDENT_AMBULATORY_CARE_PROVIDER_SITE_OTHER): Payer: 59 | Admitting: Physician Assistant

## 2011-08-16 VITALS — BP 161/86 | HR 103 | Ht 64.0 in | Wt 176.8 lb

## 2011-08-16 DIAGNOSIS — R000 Tachycardia, unspecified: Secondary | ICD-10-CM

## 2011-08-16 DIAGNOSIS — R079 Chest pain, unspecified: Secondary | ICD-10-CM

## 2011-08-16 DIAGNOSIS — R0602 Shortness of breath: Secondary | ICD-10-CM

## 2011-08-16 DIAGNOSIS — I10 Essential (primary) hypertension: Secondary | ICD-10-CM

## 2011-08-16 MED ORDER — NITROGLYCERIN 0.4 MG SL SUBL: 0.4000 mg | SUBLINGUAL_TABLET | SUBLINGUAL | Status: AC | PRN

## 2011-08-16 MED ORDER — METOPROLOL SUCCINATE ER 100 MG PO TB24: ORAL_TABLET | ORAL | Status: DC

## 2011-08-16 NOTE — Patient Instructions (Addendum)
Your physician has recommended you make the following change in your medication: INCREASE TOPROL XL 150 MG DAILY START THIS TONIGHT  START ASPIRIN 81 MG DAILY  A RPE SCRIPTION FOR NITROGLYCERIN HAS BEEN SENT IN TODAY AND YOU HAVE BEEN ADVISED HOW AND WHEN TO USE NTG  Your physician recommends that you return for lab work in: TODAY BMET, CBC W/DIFF, BNP  Your physician has requested that you have en exercise stress myoview. For further information please visit [email or web address] Please follow instruction sheet, as given.

## 2011-08-16 NOTE — Telephone Encounter (Signed)
I spoke with the patient. She states for the past 2 weeks, she has had intermittent chest pain. Her last episode was last night around bedtime and lasted about 10 minutes. She did not have any other associated symptoms. She feels that her heart rates have been well controlled on metoprolol. She is not certain if this is medication related. I advised I am not certain, but as she describes her pain as "crushing" when it occurs, I have explained she should be evaluated and have an EKG done. She does not have a present diagnosis of CAD. She last saw Dr. Graciela Husbands in 05/2009. I have offered her an appointment today with the PA at 3:40 pm. She would like to come in. She will check with her supervisor to make sure this is ok. She will call back if she needs to cancel this afternoon.

## 2011-08-16 NOTE — Progress Notes (Signed)
[address] [place], [place]  [address] Phone: [phone] Fax:  [phone]  Date:  08/16/2011   Name:  Barbara Callahan   DOB:  [birth year]   MRN:  [id number]  PCP:  Rene Paci, MD  Primary Cardiologist/Primary Electrophysiologist:  Dr. Sherryl Manges    History of Present Illness: Barbara Callahan is a 61 y.o. female who is added on to my schedule for evaluation of chest pain.  She was evaluated back in 02/2009 for chest pain. She was seen by Dr. Graciela Husbands at that time who thought that she likely had POTS. She ruled out for myocardial infarction. Echocardiogram 04/2009: EF 55-60%, grade 1 diastolic dysfunction.  Last seen by Dr. Graciela Husbands 05/2009.  Over the last week, she notes substernal heaviness that is episodic.  She may get it at rest.  She can bring it on with exertion.  She can exert herself without chest pain.  No radiating symptoms.  She does note assoc dyspnea.  No assoc nausea, diaphoresis, syncope.  She sleeps on 2 pillows chronically.  No PND.  Notes LE edema that is mild and fairly recent in onset.  She does not note any increased palpitations or lightheadedness with standing.    Wt Readings from Last 3 Encounters:  08/16/11 176 lb 12.8 oz (80.196 kg)  05/21/09 162 lb (73.483 kg)  04/20/09 160 lb (72.576 kg)     Potassium  Date/Time Value Range Status  06/15/2011 10:47 AM 3.9  3.5 - 5.1 mEq/L Final     Creatinine, Ser  Date/Time Value Range Status  06/15/2011 10:47 AM 0.86  0.50 - 1.10 mg/dL Final     ALT  Date/Time Value Range Status  06/15/2011 10:47 AM 26  0 - 35 U/L Final   Hemoglobin  Date/Time Value Range Status  06/15/2011 10:47 AM 12.7  12.0 - 15.0 g/dL Final    Past Medical History  Diagnosis Date  . Chest pain   . Hypertension     Echocardiogram 04/2009: EF 55-60%, grade 1 diastolic dysfunction  . Gastroesophageal reflux disease   . Anemia   . Hypokalemia   . POTS (postural orthostatic tachycardia syndrome)   . Tachycardia       Current Outpatient Prescriptions  Medication Sig Dispense Refill  . cyclobenzaprine (FLEXERIL) 10 MG tablet Take 10 mg by mouth daily as needed.        . ferrous sulfate (LONGS FERROUS SULFATE FE) 325 (65 FE) MG tablet Take 325 mg by mouth daily.        . metoprolol succinate (TOPROL-XL) 100 MG 24 hr tablet Take 1 tablet (100 mg total) by mouth daily.  30 tablet  6  . RABEprazole (ACIPHEX) 20 MG tablet Take 20 mg by mouth daily as needed. For reflux        Allergies: No Known Allergies  History  Substance Use Topics  . Smoking status: Never Smoker   . Smokeless tobacco: Not on file  . Alcohol Use: No     ROS:  Please see the history of present illness.   Notes hot flashes.    All other systems reviewed and negative.   PHYSICAL EXAM: VS:  BP 161/86  Pulse 103  Ht 5\' 4"  (1.626 m)  Wt 176 lb 12.8 oz (80.196 kg)  BMI 30.35 kg/m2  Filed Vitals:   08/16/11 1602 08/16/11 1604 08/16/11 1606 08/16/11 1610  BP: 147/84 163/100 169/93 161/86  Pulse: 104 103 102 103  Height:  5\' 4"  (1.626 m)  Weight:    176 lb 12.8 oz (80.196 kg)     Well nourished, well developed, in no acute distress HEENT: normal Neck: no JVD Vascular: no carotid bruits Cardiac:  normal S1, S2; RRR; no murmur Lungs:  clear to auscultation bilaterally, no wheezing, rhonchi or rales Abd: soft, nontender, no hepatomegaly Ext: trace bilateral LE edema Skin: warm and dry Neuro:  CNs 2-12 intact, no focal abnormalities noted  EKG:  Sinus tachy, HR 102, normal axis, NSSTTW changes      ASSESSMENT AND PLAN:  1.  Chest pain Typical > atypical features.   ? If related to uncontrolled HTN.  Increase Metoprolol XL to 150 mg QD. Arrange ETT-Myoview to r/o ischemia. She will start ASA 81 mg QD for now. I also gave her NTG to use prn.  She knows to call us or go to the ED if she has worse symptoms.  2.  Hypertension Uncontrolled. Adjust Metoprolol as noted. If she cannot tolerate this or BP remains  uncontrolled add ACE or ARB.  3.  Dyspnea She does not look particularly volume overloaded on exam. She had difficulty with POTS symptoms in the past. I will check a BMET and BNP today.  If her BNP is high, add low dose diuretic. Also, check CBC.  4.  POTS She is not orthostatic today and these symptoms appear stable.   Signed, Tereso Newcomer, PA-C  4:16 PM 08/16/2011

## 2011-08-16 NOTE — Telephone Encounter (Signed)
New problem:  Patient calling c/o chest hurting. Wants to discuss medication

## 2011-08-17 LAB — BASIC METABOLIC PANEL
Chloride: 102 mEq/L (ref 96–112)
Potassium: 3.7 mEq/L (ref 3.5–5.1)
Sodium: 140 mEq/L (ref 135–145)

## 2011-08-17 LAB — CBC WITH DIFFERENTIAL/PLATELET
Eosinophils Relative: 3.1 % (ref 0.0–5.0)
HCT: 39 % (ref 36.0–46.0)
Hemoglobin: 12.3 g/dL (ref 12.0–15.0)
Lymphs Abs: 1.4 10*3/uL (ref 0.7–4.0)
MCV: 88 fl (ref 78.0–100.0)
Monocytes Relative: 9.3 % (ref 3.0–12.0)
Neutro Abs: 4.1 10*3/uL (ref 1.4–7.7)
WBC: 6.3 10*3/uL (ref 4.5–10.5)

## 2011-08-18 ENCOUNTER — Ambulatory Visit (HOSPITAL_COMMUNITY): Payer: 59 | Attending: Internal Medicine | Admitting: Radiology

## 2011-08-18 ENCOUNTER — Telehealth: Payer: Self-pay | Admitting: *Deleted

## 2011-08-18 VITALS — BP 165/96 | Ht 64.0 in | Wt 176.0 lb

## 2011-08-18 DIAGNOSIS — R079 Chest pain, unspecified: Secondary | ICD-10-CM | POA: Insufficient documentation

## 2011-08-18 DIAGNOSIS — R0602 Shortness of breath: Secondary | ICD-10-CM | POA: Insufficient documentation

## 2011-08-18 MED ORDER — TECHNETIUM TC 99M TETROFOSMIN IV KIT
33.0000 | PACK | Freq: Once | INTRAVENOUS | Status: AC | PRN
  Administered 2011-08-18: 33 via INTRAVENOUS

## 2011-08-18 MED ORDER — TECHNETIUM TC 99M TETROFOSMIN IV KIT
11.0000 | PACK | Freq: Once | INTRAVENOUS | Status: AC | PRN
  Administered 2011-08-18: 11 via INTRAVENOUS

## 2011-08-18 NOTE — Telephone Encounter (Signed)
Message copied by Tarri Fuller on Thu Aug 18, 2011 10:31 AM ------      Message from: [place], [place] T      Created: Wed Aug 17, 2011  5:52 PM       Please notify patient that the lab results are ok.      Tereso Newcomer, PA-C  5:52 PM 08/17/2011

## 2011-08-18 NOTE — Telephone Encounter (Signed)
lmom labs ok 

## 2011-08-18 NOTE — Progress Notes (Signed)
[facility] SITE 3 NUCLEAR MED [address] [place] [address] [phone]  Cardiology Nuclear Med Study  Barbara Callahan is a 61 y.o. female     MRN : [id number]     DOB: 09-03-1950  Procedure Date: 08/18/2011  Nuclear Med Background Indication for Stress Test:  Evaluation for Ischemia and Abnormal EKG History:  4/11 ECHO: EF: 55-60% Unspecified Tachycardia,POTS Cardiac Risk Factors: Hypertension  Symptoms:  Chest Pain, Rapid HR and SOB   Nuclear Pre-Procedure Caffeine/Decaff Intake:  None NPO After: 7:00pm   Lungs: clear O2 Sat: 98% on room air. IV 0.9% NS with Angio Cath:  22g  IV Site: L Hand  IV Started by:  Doyne Keel, CNMT  Chest Size (in):  34 Cup Size: B  Height: 5\' 4"  (1.626 m)  Weight:  176 lb (79.833 kg)  BMI:  Body mass index is 30.21 kg/(m^2). Tech Comments:  Patient took Toprol yesterday evening    Nuclear Med Study 1 or 2 day study: 1 day  Stress Test Type:  Stress  Reading MD: Dietrich Pates, MD  Order Authorizing Provider:  S.Klein  Resting Radionuclide: Technetium 28m Tetrofosmin  Resting Radionuclide Dose: 11.0 mCi   Stress Radionuclide:  Technetium 42m Tetrofosmin  Stress Radionuclide Dose: 33.0 mCi           Stress Protocol Rest HR: 84 Stress HR: 155  Rest BP: 165/96 Stress BP: 179/91  Exercise Time (min): 6:00 METS: 7.0   Predicted Max HR: 161 bpm % Max HR: 99.38 bpm Rate Pressure Product: 28413   Dose of Adenosine (mg):  n/a Dose of Lexiscan: n/a mg  Dose of Atropine (mg): n/a Dose of Dobutamine: n/a mcg/kg/min (at max HR)  Stress Test Technologist: Milana Na, EMT-P  Nuclear Technologist:  Domenic Polite, CNMT     Rest Procedure:  Myocardial perfusion imaging was performed at rest 45 minutes following the intravenous administration of Technetium 65m Tetrofosmin. Rest ECG: NSR - Normal EKG  Stress Procedure:  The patient performed treadmill exercise using a Bruce  Protocol for 6:00 minutes. The patient stopped due  to fatigue and denied any chest pain.  There were non specific ST-T wave changes.  Technetium 23m Tetrofosmin was injected at peak exercise and myocardial perfusion imaging was performed after a brief delay. Stress ECG: 1 mm flat ST depression in leads III, AVF at  5 min recovery.  QPS Raw Data Images:  Images were motion corrected.  Soft tissue (diaphragm) underlies the heart. Stress Images:  Normal perfusion with minimal apical thinning. Rest Images:  Normal perfusion. Subtraction (SDS):  No evidence of ischemia. Transient Ischemic Dilatation (Normal <1.22):  0.97 Lung/Heart Ratio (Normal <0.45):  0.21  Quantitative Gated Spect Images QGS EDV:  65 ml QGS ESV:  21 ml  Impression Exercise Capacity:  Good exercise capacity. BP Response:  Normal blood pressure response. Clinical Symptoms:  No chest pain. ECG Impression:   1 mm flat ST depression at 5 min recovery in leads III, AVF  Overall electrically positive though specificity is limited. Comparison with Prior Nuclear Study:  No prior study  Overall Impression:  Normal stress nuclear study.  LV Ejection Fraction: 68%.  LV Wall Motion:  NL LV Function; NL Wall Motion  Dietrich Pates

## 2011-08-22 ENCOUNTER — Telehealth: Payer: Self-pay | Admitting: Physician Assistant

## 2011-08-22 NOTE — Telephone Encounter (Signed)
Fu call °Pt calling back about test results °

## 2011-08-23 ENCOUNTER — Telehealth: Payer: Self-pay | Admitting: *Deleted

## 2011-08-23 NOTE — Telephone Encounter (Signed)
pt notified of stress test results abd lab results today, gave verbal understanding 

## 2011-08-23 NOTE — Telephone Encounter (Signed)
pt notified of stress test results abd lab results today, gave verbal understanding

## 2011-08-30 ENCOUNTER — Encounter: Payer: Self-pay | Admitting: Physician Assistant

## 2011-08-30 ENCOUNTER — Ambulatory Visit (INDEPENDENT_AMBULATORY_CARE_PROVIDER_SITE_OTHER): Payer: 59 | Admitting: Physician Assistant

## 2011-08-30 VITALS — BP 140/98 | HR 102 | Ht 64.0 in | Wt 178.0 lb

## 2011-08-30 DIAGNOSIS — R000 Tachycardia, unspecified: Secondary | ICD-10-CM

## 2011-08-30 DIAGNOSIS — I498 Other specified cardiac arrhythmias: Secondary | ICD-10-CM

## 2011-08-30 DIAGNOSIS — I951 Orthostatic hypotension: Secondary | ICD-10-CM

## 2011-08-30 DIAGNOSIS — R079 Chest pain, unspecified: Secondary | ICD-10-CM

## 2011-08-30 DIAGNOSIS — I10 Essential (primary) hypertension: Secondary | ICD-10-CM

## 2011-08-30 MED ORDER — LISINOPRIL 10 MG PO TABS: 10.0000 mg | ORAL_TABLET | Freq: Every day | ORAL | Status: DC

## 2011-08-30 NOTE — Progress Notes (Signed)
[address] [place], [place]  [address] Phone: [phone] Fax:  [phone]  Date:  08/30/2011   Name:  Barbara Callahan   DOB:  November 09, 1950   MRN:  [id number]  PCP:  Rene Paci, MD  Primary Cardiologist/Primary Electrophysiologist:  Dr. Sherryl Manges    History of Present Illness: Barbara Callahan is a 61 y.o. female who returns for follow up.  She was evaluated back in 02/2009 for chest pain. She was seen by Dr. Graciela Husbands at that time who thought that she likely had POTS. She ruled out for myocardial infarction. Echocardiogram 04/2009: EF 55-60%, grade 1 diastolic dysfunction.  Last seen by Dr. Graciela Husbands 05/2009.  I saw her 08/16/11 with chest discomfort. Her blood pressure was elevated. I adjusted her Toprol and set her up for a stress test. ETT-Myoview 08/18/11: No ischemia, EF 68%. She feels much better. No further chest pain. She denies dyspnea, syncope, orthopnea, PND. She has mild pedal edema that is unchanged.  Wt Readings from Last 3 Encounters:  08/30/11 178 lb (80.74 kg)  08/18/11 176 lb (79.833 kg)  08/16/11 176 lb 12.8 oz (80.196 kg)     Potassium  Date/Time Value Range Status  08/16/2011  4:54 PM 3.7  3.5 - 5.1 mEq/L Final   Creatinine, Ser  Date/Time Value Range Status  08/16/2011  4:54 PM 0.9  0.4 - 1.2 mg/dL Final   Pro B Natriuretic peptide (BNP)  Date/Time Value Range Status  08/16/2011  4:54 PM 12.0  0.0 - 100.0 pg/mL Final   Hemoglobin  Date/Time Value Range Status  08/16/2011  4:54 PM 12.3  12.0 - 15.0 g/dL Final    Past Medical History  Diagnosis Date  . Chest pain     ETT-Myoview 08/18/11: No ischemia, EF 68%  . Hypertension     Echocardiogram 04/2009: EF 55-60%, grade 1 diastolic dysfunction  . Gastroesophageal reflux disease   . Anemia   . Hypokalemia   . POTS (postural orthostatic tachycardia syndrome)   . Tachycardia     Current Outpatient Prescriptions  Medication Sig Dispense Refill  . aspirin EC 81 MG tablet Take 1  tablet (81 mg total) by mouth daily.      . cyclobenzaprine (FLEXERIL) 10 MG tablet Take 10 mg by mouth daily as needed.        . ferrous sulfate (LONGS FERROUS SULFATE FE) 325 (65 FE) MG tablet Take 325 mg by mouth daily.        . metoprolol succinate (TOPROL-XL) 100 MG 24 hr tablet TAKE 1 AND 1/2 (HALF) TABLETS AT BEDTIME TO = 150 MG  45 tablet  11  . nitroGLYCERIN (NITROSTAT) 0.4 MG SL tablet Place 1 tablet (0.4 mg total) under the tongue every 5 (five) minutes as needed for chest pain.  25 tablet  3  . RABEprazole (ACIPHEX) 20 MG tablet Take 20 mg by mouth daily as needed. For reflux        Allergies: No Known Allergies  History  Substance Use Topics  . Smoking status: Never Smoker   . Smokeless tobacco: Not on file  . Alcohol Use: No     ROS:  Please see the history of present illness.   All other systems reviewed and negative.   PHYSICAL EXAM: VS:  BP 140/98  Pulse 102  Ht 5\' 4"  (1.626 m)  Wt 178 lb (80.74 kg)  BMI 30.55 kg/m2  Well nourished, well developed, in no acute distress HEENT: normal Neck: no  JVD Endocrine: No thyromegaly Cardiac:  normal S1, S2; RRR; no murmur Lungs:  clear to auscultation bilaterally, no wheezing, rhonchi or rales Abd: soft, nontender, no hepatomegaly Ext: trace bilateral LE edema Skin: warm and dry Neuro:  CNs 2-12 intact, no focal abnormalities noted  EKG:  Sinus tachy, HR 102, normal axis, NSSTTW changes     ASSESSMENT AND PLAN:  1. Chest Pain Myoview normal. No further cardiac workup. Symptoms have resolved.  2. Hypertension Uncontrolled. I will add lisinopril 10 mg daily. Followup in one to 2 weeks for blood pressure check with the nurse and a basic metabolic panel. She can see me in 6 weeks in followup. Low sodium diet.  3. Tachycardia She continues to have an elevated heart rate even with a higher dose of Toprol. I will check a TSH when she returns in one to 2 weeks for a basic metabolic panel.  4. Postural Orthostatic  Tachycardia Syndrome Overall, this seems to be quiescent. I would avoid diuretics, if at all possible, in the treatment of her blood pressure.  Luna Glasgow, PA-C  12:34 PM 08/30/2011

## 2011-08-30 NOTE — Patient Instructions (Addendum)
3 to 4 Gram Sodium Diet, No Added Salt (NAS) A 3 to 4 gram sodium diet restricts the amount of sodium in the diet to no more than 3 to 4 g or 3000 to 4000 mg daily. Limiting the amount of sodium is often used to help lower blood pressure. It is important if you have heart, liver, or kidney problems. Many foods contain sodium for flavor and sometimes as a preservative. When the amount of sodium in a diet needs to be low, it is important to know what to look for when choosing foods and drinks. The following includes some information and guidelines to help make it easier for you to adapt to a low sodium diet. QUICK TIPS  Do not add salt to food.   Avoid convenience items and fast food.   Choose unsalted snack foods.   Buy lower sodium products, often labeled as "lower sodium" or "no salt added."   Check food labels to learn how much sodium is in 1 serving.   When eating at a restaurant, ask that your food be prepared with less salt or none, if possible.  READING FOOD LABELS FOR SODIUM INFORMATION The nutrition facts label is a good place to find how much sodium is in foods. Look for products with no more than 500 to 600 mg of sodium per meal and no more than 150 mg per serving. Remember that 3 to 4 g = 3000 to 4000 mg. The food label may also list foods as:  Sodium-free: Less than 5 mg in a serving.   Very low sodium: 35 mg or less in a serving.   Low-sodium: 140 mg or less in a serving.   Light in sodium: 50% less sodium in a serving. For example, if a food that usually has 300 mg of sodium is changed to become light in sodium, it will have 150 mg of sodium.   Reduced sodium: 25% less sodium in a serving. For example, if a food that usually has 400 mg of sodium is changed to reduced sodium, it will have 300 mg of sodium.  CHOOSING FOODS Grains  Avoid: Salted crackers and snack items. Bread stuffing and biscuit mixes. Seasoned rice or pasta mixes.   Choose: Unsalted snack items.  English muffins, breads, and rolls. Homemade pancakes and waffles. Most cereals. Pasta.  Meats  Avoid: Salted, canned, smoked, spiced, pickled meats, including fish and poultry. Bacon, ham, sausage, cold cuts, hot dogs, anchovies.   Choose: Low-sodium canned tuna and salmon. Fresh or frozen meat, poultry, and fish.  Dairy  Avoid: Processed cheese and spreads. Cottage cheese. Buttermilk and condensed milk. Regular cheese.   Choose: Milk. Low-sodium cottage cheese. Yogurt. Sour cream. Low-sodium cheese.  Fruits and Vegetables  Avoid: Regular canned vegetables. Regular canned tomato sauce and paste. Frozen vegetables in sauces. Olives. Angie Fava. Relishes. Sauerkraut.   Choose: Low-sodium canned vegetables. Low-sodium tomato sauce and paste. Frozen or fresh vegetables. Fresh and frozen fruit.  Condiments  Avoid: Canned and packaged gravies. Worcestershire sauce. Tartar sauce. Barbecue sauce. Soy sauce. Steak sauce. Ketchup. Onion, garlic, and table salt. Meat flavorings and tenderizers.   Choose: Fresh and dried herbs and spices. Low-sodium varieties of mustard and ketchup. Lemon juice. Tabasco sauce. Horseradish.  SAMPLE 3 TO 4 GRAM SODIUM MEAL PLAN  Breakfast / Sodium (mg)  1 cup low-fat milk / [id number] mg   2 slices whole-wheat toast / 270 mg   1 tbs heart-healthy margarine / 153 mg   1 hard-boiled egg /  139 mg  Lunch / Sodium (mg)  1 cup raw carrots / 76 mg    cup hummus / 298 mg   1 cup low-fat milk / 143 mg    cup red grapes / 2 mg   1 cup low-sodium chicken and rice soup / 480 mg   10 low-sodium saltine crackers / 191 mg  Dinner / Sodium (mg)  1 cup whole-wheat pasta / 2 mg   1 cup tomato sauce / 1178 mg   3 oz lean ground beef / 57 mg   1 small side salad (1 cup raw spinach leaves,  cup cucumber,  cup yellow bell pepper) / 25 mg   1 tsp ranch dressing / 144 mg  Snack / Sodium (mg)  1 slice cheddar cheese / 258 mg   1 medium apple / 1 mg  Nutrient  Analysis  Calories: 2005   Protein: 85 g   Carbohydrate: 245 g   Fat: 78 g   Sodium: 3560 mg  Document Released: 01/03/2005 Document Revised: 12/23/2010 Document Reviewed: 04/06/2009 [facility] Patient Information 2012 [place], [place].   Your physician has recommended you make the following change in your medication: STOP Aspirin, START Lisinipril 10mg  daily   Your physician recommends that you return for lab work in: 1-2 weeks for a bmet, and tsh   Your physician recommends that you schedule a follow-up appointment in: 6 weeks with Tereso Newcomer, PA-C   Please have patient come back on lab visit for a nurses visit to get blood pressure checked and blood pressure cuff checked in 1 -2 weeks.

## 2011-09-07 ENCOUNTER — Telehealth: Payer: Self-pay | Admitting: Internal Medicine

## 2011-09-07 NOTE — Telephone Encounter (Signed)
New msg  Pt is having side effects for lisinopril. Please call

## 2011-09-07 NOTE — Telephone Encounter (Signed)
I attempted to call the patient again. No answer x 10 rings.

## 2011-09-07 NOTE — Telephone Encounter (Signed)
I attempted to call the patient x 2 tries. No answer at her contact number left on the message. I will call back.

## 2011-09-07 NOTE — Telephone Encounter (Signed)
No answer at the contact # left x 10 rings. I left a message to call at the home #.

## 2011-09-08 NOTE — Telephone Encounter (Signed)
Lets try and change betablockers to nonselective  nadololl 40 mg  Also she should follou up with her PCP aboout her bllod pressure

## 2011-09-08 NOTE — Telephone Encounter (Signed)
I left a message for the patient to call. Per Dr. Graciela Husbands, the patient can stop lisinopril and he would like to switch her metoprolol to nadolol 40 mg once daily.

## 2011-09-08 NOTE — Telephone Encounter (Signed)
I spoke with the patient. She started on lisinopril on 8/13 per Tereso Newcomer, PA. Over the weekend, she developed edema in her hands and feet, as well as a dry cough. She states she is not able to void as well either. She would like to stop lisinopril. Her BP reading at home has been 138/85 & HR- 89. She took her last dose of lisinopril last night. She is also on metoprolol 150 mg daily. I explained I would review with Dr. Graciela Husbands and call her back today. She is agreeable.

## 2011-09-08 NOTE — Telephone Encounter (Signed)
Patient returning nurse call, she can be reached at [phone]

## 2011-09-09 MED ORDER — NADOLOL 40 MG PO TABS: 40.0000 mg | ORAL_TABLET | Freq: Every day | ORAL | Status: DC

## 2011-09-09 NOTE — Telephone Encounter (Signed)
Patient is aware to stop Lisinopril and switch her Metoprolol to Nadolol 40 mg once daily, and she  is to [facility] with her PCP about her blood pressure. Patient verbalized understanding.

## 2011-09-09 NOTE — Telephone Encounter (Signed)
Patient returning nurse call, she can reached at [phone]

## 2011-09-15 ENCOUNTER — Other Ambulatory Visit: Payer: 59

## 2011-09-15 ENCOUNTER — Ambulatory Visit: Payer: 59

## 2011-10-10 ENCOUNTER — Ambulatory Visit: Payer: 59 | Admitting: Physician Assistant

## 2011-10-18 LAB — HM PAP SMEAR: HM Pap smear: NORMAL

## 2011-10-18 LAB — HM MAMMOGRAPHY: HM Mammogram: NORMAL

## 2011-12-16 ENCOUNTER — Ambulatory Visit: Payer: 59 | Admitting: Internal Medicine

## 2011-12-23 ENCOUNTER — Other Ambulatory Visit (INDEPENDENT_AMBULATORY_CARE_PROVIDER_SITE_OTHER): Payer: 59

## 2011-12-23 ENCOUNTER — Ambulatory Visit (INDEPENDENT_AMBULATORY_CARE_PROVIDER_SITE_OTHER): Payer: 59 | Admitting: Internal Medicine

## 2011-12-23 ENCOUNTER — Encounter: Payer: Self-pay | Admitting: Internal Medicine

## 2011-12-23 VITALS — BP 172/100 | HR 103 | Temp 97.3°F | Ht 64.0 in | Wt 176.0 lb

## 2011-12-23 DIAGNOSIS — Z780 Asymptomatic menopausal state: Secondary | ICD-10-CM

## 2011-12-23 DIAGNOSIS — Z0000 Encounter for general adult medical examination without abnormal findings: Secondary | ICD-10-CM

## 2011-12-23 DIAGNOSIS — I10 Essential (primary) hypertension: Secondary | ICD-10-CM

## 2011-12-23 LAB — CBC WITH DIFFERENTIAL/PLATELET
Basophils Absolute: 0 10*3/uL (ref 0.0–0.1)
Eosinophils Absolute: 0.2 10*3/uL (ref 0.0–0.7)
Eosinophils Relative: 3.3 % (ref 0.0–5.0)
MCHC: 32.5 g/dL (ref 30.0–36.0)
MCV: 84 fl (ref 78.0–100.0)
Monocytes Absolute: 0.5 10*3/uL (ref 0.1–1.0)
Neutrophils Relative %: 47.4 % (ref 43.0–77.0)
Platelets: 254 10*3/uL (ref 150.0–400.0)
RDW: 14 % (ref 11.5–14.6)
WBC: 5.5 10*3/uL (ref 4.5–10.5)

## 2011-12-23 LAB — URINALYSIS, ROUTINE W REFLEX MICROSCOPIC
Bilirubin Urine: NEGATIVE
Hgb urine dipstick: NEGATIVE
Nitrite: NEGATIVE
Urobilinogen, UA: 0.2 (ref 0.0–1.0)

## 2011-12-23 LAB — HEPATIC FUNCTION PANEL
Alkaline Phosphatase: 98 U/L (ref 39–117)
Bilirubin, Direct: 0.1 mg/dL (ref 0.0–0.3)
Total Protein: 8.2 g/dL (ref 6.0–8.3)

## 2011-12-23 LAB — BASIC METABOLIC PANEL
BUN: 20 mg/dL (ref 6–23)
Chloride: 103 mEq/L (ref 96–112)
Creatinine, Ser: 0.9 mg/dL (ref 0.4–1.2)
Glucose, Bld: 106 mg/dL — ABNORMAL HIGH (ref 70–99)

## 2011-12-23 LAB — TSH: TSH: 0.83 u[IU]/mL (ref 0.35–5.50)

## 2011-12-23 LAB — LIPID PANEL: VLDL: 15.4 mg/dL (ref 0.0–40.0)

## 2011-12-23 NOTE — Assessment & Plan Note (Signed)
BP Readings from Last 3 Encounters:  12/23/11 172/100  08/30/11 140/98  08/18/11 165/96   uncontrolled Did not make change to nonselective beta-blocker as recommended 08/2011 by EP cards Change now: stop metoprolol and start nadolol we reviewed potential risk/benefit and possible side effects - pt understands and agrees to same  Patient agrees to call if problems or if uncontrolled blood pressure/heart rate at home (hx POTS, asymptomatic)

## 2011-12-23 NOTE — Patient Instructions (Signed)
It was good to see you today. We have reviewed your prior records including labs and tests today Health Maintenance reviewed - all recommended immunizations and age-appropriate screenings are up-to-date. Will update your immunization records as available Test(s) ordered today. Your results will be released to MyChart (or called to you) after review, usually within 72hours after test completion. If any changes need to be made, you will be notified at that same time. we'll schedule bone density test. Our office will contact you regarding appointment(s) once made. Stop metoprolol and start nadolol as discussed - contact us if your heart rate over 100s or if blood pressure over 140/85 for adjustments Other Medications reviewed and updated Please schedule followup in 3-4 weeks to recheck blood pressure and adjust medications as needed, call sooner if problems.    Health Maintenance, Females A healthy lifestyle and preventative care can promote health and wellness.  Maintain regular health, dental, and eye exams.   Eat a healthy diet. Foods like vegetables, fruits, whole grains, low-fat dairy products, and lean protein foods contain the nutrients you need without too many calories. Decrease your intake of foods high in solid fats, added sugars, and salt. Get information about a proper diet from your caregiver, if necessary.   Regular physical exercise is one of the most important things you can do for your health. Most adults should get at least 150 minutes of moderate-intensity exercise (any activity that increases your heart rate and causes you to sweat) each week. In addition, most adults need muscle-strengthening exercises on 2 or more days a week.     Maintain a healthy weight. The body mass index (BMI) is a screening tool to identify possible weight problems. It provides an estimate of body fat based on height and weight. Your caregiver can help determine your BMI, and can help you achieve or  maintain a healthy weight. For adults 20 years and older:   A BMI below 18.5 is considered underweight.   A BMI of 18.5 to 24.9 is normal.   A BMI of 25 to 29.9 is considered overweight.   A BMI of 30 and above is considered obese.   Maintain normal blood lipids and cholesterol by exercising and minimizing your intake of saturated fat. Eat a balanced diet with plenty of fruits and vegetables. Blood tests for lipids and cholesterol should begin at age 47 and be repeated every 5 years. If your lipid or cholesterol levels are high, you are over 50, or you are a high risk for heart disease, you may need your cholesterol levels checked more frequently. Ongoing high lipid and cholesterol levels should be treated with medicines if diet and exercise are not effective.   If you smoke, find out from your caregiver how to quit. If you do not use tobacco, do not start.   If you are pregnant, do not drink alcohol. If you are breastfeeding, be very cautious about drinking alcohol. If you are not pregnant and choose to drink alcohol, do not exceed 1 drink per day. One drink is considered to be 12 ounces (355 mL) of beer, 5 ounces (148 mL) of wine, or 1.5 ounces (44 mL) of liquor.   Avoid use of street drugs. Do not share needles with anyone. Ask for help if you need support or instructions about stopping the use of drugs.   High blood pressure causes heart disease and increases the risk of stroke. Blood pressure should be checked at least every 1 to 2 years. Ongoing  high blood pressure should be treated with medicines, if weight loss and exercise are not effective.   If you are 52 to 60 years old, ask your caregiver if you should take aspirin to prevent strokes.   Diabetes screening involves taking a blood sample to check your fasting blood sugar level. This should be done once every 3 years, after age 16, if you are within normal weight and without risk factors for diabetes. Testing should be considered at a  younger age or be carried out more frequently if you are overweight and have at least 1 risk factor for diabetes.   Breast cancer screening is essential preventative care for women. You should practice "breast self-awareness." This means understanding the normal appearance and feel of your breasts and may include breast self-examination. Any changes detected, no matter how small, should be reported to a caregiver. Women in their 44s and 30s should have a clinical breast exam (CBE) by a caregiver as part of a regular health exam every 1 to 3 years. After age 67, women should have a CBE every year. Starting at age 42, women should consider having a mammogram (breast X-ray) every year. Women who have a family history of breast cancer should talk to their caregiver about genetic screening. Women at a high risk of breast cancer should talk to their caregiver about having an MRI and a mammogram every year.   The Pap test is a screening test for cervical cancer. Women should have a Pap test starting at age 48. Between ages 18 and 56, Pap tests should be repeated every 2 years. Beginning at age 2, you should have a Pap test every 3 years as long as the past 3 Pap tests have been normal. If you had a hysterectomy for a problem that was not cancer or a condition that could lead to cancer, then you no longer need Pap tests. If you are between ages 38 and 49, and you have had normal Pap tests going back 10 years, you no longer need Pap tests. If you have had past treatment for cervical cancer or a condition that could lead to cancer, you need Pap tests and screening for cancer for at least 20 years after your treatment. If Pap tests have been discontinued, risk factors (such as a new sexual partner) need to be reassessed to determine if screening should be resumed. Some women have medical problems that increase the chance of getting cervical cancer. In these cases, your caregiver may recommend more frequent screening and  Pap tests.   The human papillomavirus (HPV) test is an additional test that may be used for cervical cancer screening. The HPV test looks for the virus that can cause the cell changes on the cervix. The cells collected during the Pap test can be tested for HPV. The HPV test could be used to screen women aged 36 years and older, and should be used in women of any age who have unclear Pap test results. After the age of 45, women should have HPV testing at the same frequency as a Pap test.   Colorectal cancer can be detected and often prevented. Most routine colorectal cancer screening begins at the age of 69 and continues through age 50. However, your caregiver may recommend screening at an earlier age if you have risk factors for colon cancer. On a yearly basis, your caregiver may provide home test kits to check for hidden blood in the stool. Use of a small camera at the  end of a tube, to directly examine the colon (sigmoidoscopy or colonoscopy), can detect the earliest forms of colorectal cancer. Talk to your caregiver about this at age 61, when routine screening begins. Direct examination of the colon should be repeated every 5 to 10 years through age 28, unless early forms of pre-cancerous polyps or small growths are found.   Hepatitis C blood testing is recommended for all people born from [birth year] through 1964 and any individual with known risks for hepatitis C.   Practice safe sex. Use condoms and avoid high-risk sexual practices to reduce the spread of sexually transmitted infections (STIs). Sexually active women aged 72 and younger should be checked for Chlamydia, which is a common sexually transmitted infection. Older women with new or multiple partners should also be tested for Chlamydia. Testing for other STIs is recommended if you are sexually active and at increased risk.   Osteoporosis is a disease in which the bones lose minerals and strength with aging. This can result in serious bone fractures.  The risk of osteoporosis can be identified using a bone density scan. Women ages 43 and over and women at risk for fractures or osteoporosis should discuss screening with their caregivers. Ask your caregiver whether you should be taking a calcium supplement or vitamin D to reduce the rate of osteoporosis.   Menopause can be associated with physical symptoms and risks. Hormone replacement therapy is available to decrease symptoms and risks. You should talk to your caregiver about whether hormone replacement therapy is right for you.   Use sunscreen with a sun protection factor (SPF) of 30 or greater. Apply sunscreen liberally and repeatedly throughout the day. You should seek shade when your shadow is shorter than you. Protect yourself by wearing long sleeves, pants, a wide-brimmed hat, and sunglasses year round, whenever you are outdoors.   Notify your caregiver of new moles or changes in moles, especially if there is a change in shape or color. Also notify your caregiver if a mole is larger than the size of a pencil eraser.   Stay current with your immunizations.  Document Released: 07/19/2010 Document Revised: 03/28/2011 Document Reviewed: 07/19/2010 [facility] Patient Information 2013 [place], [place].   Hypertension As your heart beats, it forces blood through your arteries. This force is your blood pressure. If the pressure is too high, it is called hypertension (HTN) or high blood pressure. HTN is dangerous because you may have it and not know it. High blood pressure may mean that your heart has to work harder to pump blood. Your arteries may be narrow or stiff. The extra work puts you at risk for heart disease, stroke, and other problems.   Blood pressure consists of two numbers, a higher number over a lower, 110/72, for example. It is stated as "110 over 72." The ideal is below 120 for the top number (systolic) and under 80 for the bottom (diastolic). Write down your blood pressure today. You  should pay close attention to your blood pressure if you have certain conditions such as:  Heart failure.   Prior heart attack.   Diabetes   Chronic kidney disease.   Prior stroke.   Multiple risk factors for heart disease.  To see if you have HTN, your blood pressure should be measured while you are seated with your arm held at the level of the heart. It should be measured at least twice. A one-time elevated blood pressure reading (especially in the Emergency Department) does not mean that you  need treatment. There may be conditions in which the blood pressure is different between your right and left arms. It is important to see your caregiver soon for a recheck. Most people have essential hypertension which means that there is not a specific cause. This type of high blood pressure may be lowered by changing lifestyle factors such as:  Stress.   Smoking.   Lack of exercise.   Excessive weight.   Drug/tobacco/alcohol use.   Eating less salt.  Most people do not have symptoms from high blood pressure until it has caused damage to the body. Effective treatment can often prevent, delay or reduce that damage. TREATMENT   When a cause has been identified, treatment for high blood pressure is directed at the cause. There are a large number of medications to treat HTN. These fall into several categories, and your caregiver will help you select the medicines that are best for you. Medications may have side effects. You should review side effects with your caregiver. If your blood pressure stays high after you have made lifestyle changes or started on medicines,    Your medication(s) may need to be changed.   Other problems may need to be addressed.   Be certain you understand your prescriptions, and know how and when to take your medicine.   Be sure to follow up with your caregiver within the time frame advised (usually within two weeks) to have your blood pressure rechecked and to  review your medications.   If you are taking more than one medicine to lower your blood pressure, make sure you know how and at what times they should be taken. Taking two medicines at the same time can result in blood pressure that is too low.  SEEK IMMEDIATE MEDICAL CARE IF:  You develop a severe headache, blurred or changing vision, or confusion.   You have unusual weakness or numbness, or a faint feeling.   You have severe chest or abdominal pain, vomiting, or breathing problems.  MAKE SURE YOU:    Understand these instructions.   Will watch your condition.   Will get help right away if you are not doing well or get worse.  Document Released: 01/03/2005 Document Revised: 03/28/2011 Document Reviewed: 08/24/2007 [facility] Patient Information 2013 [place], [place].

## 2011-12-23 NOTE — Progress Notes (Signed)
Subjective:    Patient ID: Barbara Callahan, female    DOB: 1950/08/18, 61 y.o.   MRN: [id number]  HPI New patient to me and our division, here to establish with PCP Known to our cardiology division since 2011 patient is here today for annual physical. Patient feels well overall.  Past Medical History  Diagnosis Date  . Chest pain     ETT-Myoview 08/18/11: No ischemia, EF 68%  . Hypertension     Echocardiogram 04/2009: EF 55-60%, grade 1 diastolic dysfunction  . Gastroesophageal reflux disease   . Anemia   . POTS (postural orthostatic tachycardia syndrome) 02/2009 dx    unspecified tachycardia   Family History  Problem Relation Age of Onset  . Heart attack Father 27  . Breast cancer Mother 19  . Pancreatic cancer Maternal Grandmother    History  Substance Use Topics  . Smoking status: Never Smoker   . 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 Alcohol Use: No    Review of Systems Constitutional: Negative for fever or weight change.  Respiratory: Negative for cough and shortness of breath.   Cardiovascular: Negative for chest pain or palpitations.  Gastrointestinal: Negative for abdominal pain, no bowel changes.  Musculoskeletal: Negative for gait problem or joint swelling.  Skin: Negative for rash.  Neurological: Negative for dizziness or headache.  No other specific complaints in a complete review of systems (except as listed in HPI above).     Objective:   Physical Exam BP 172/100  Pulse 103  Temp 97.3 F (36.3 C) (Oral)  Ht 5\' 4"  (1.626 m)  Wt 176 lb (79.833 kg)  BMI 30.21 kg/m2  SpO2 97% Wt Readings from Last 3 Encounters:  12/23/11 176 lb (79.833 kg)  08/30/11 178 lb (80.74 kg)  08/18/11 176 lb (79.833 kg)   Constitutional: She appears well-developed and well-nourished. No distress.  HENT: Head: Normocephalic and atraumatic. Ears: B TMs ok, no erythema or effusion; Nose: Nose normal. Mouth/Throat: Oropharynx is  clear and moist. No oropharyngeal exudate.  Eyes: Conjunctivae and EOM are normal. Pupils are equal, round, and reactive to light. No scleral icterus.  Neck: Normal range of motion. Neck supple. No JVD present. No thyromegaly present.  Cardiovascular: Normal rate, regular rhythm and normal heart sounds.  No murmur heard. No BLE edema. Pulmonary/Chest: Effort normal and breath sounds normal. No respiratory distress. She has no wheezes.  Abdominal: Soft. Bowel sounds are normal. She exhibits no distension. There is no tenderness. no masses Musculoskeletal: Normal range of motion, no joint effusions. No gross deformities Neurological: She is alert and oriented to person, place, and time. No cranial nerve deficit. Coordination normal.  Skin: Skin is warm and dry. No rash noted. No erythema.  Psychiatric: She has a normal mood and affect. Her behavior is normal. Judgment and thought content normal.   Lab Results  Component Value Date   WBC 6.3 08/16/2011   HGB 12.3 08/16/2011   HCT 39.0 08/16/2011   PLT 229.0 08/16/2011   GLUCOSE 93 08/16/2011   CHOL  Value: 203            * 03/14/2009   TRIG 56 03/14/2009   HDL 71 03/14/2009   LDLCALC  Value: 121        03/14/2009   ALT 26 06/15/2011   AST 22 06/15/2011   NA 140 08/16/2011   K 3.7 08/16/2011   CL 102 08/16/2011  CREATININE 0.9 08/16/2011   BUN 11 08/16/2011   CO2 28 08/16/2011   TSH 0.526 *Test methodology is 3rd generation TSH* 03/14/2009   INR 1.03 05/31/2009       Assessment & Plan:  CPX/v70.0 - Patient has been counseled on age-appropriate routine health concerns for screening and prevention. These are reviewed and up-to-date. Immunizations are up-to-date or declined. Labs ordered and reviewed.

## 2012-01-23 ENCOUNTER — Ambulatory Visit: Payer: 59 | Admitting: Internal Medicine

## 2012-01-23 ENCOUNTER — Ambulatory Visit (INDEPENDENT_AMBULATORY_CARE_PROVIDER_SITE_OTHER)
Admission: RE | Admit: 2012-01-23 | Discharge: 2012-01-23 | Disposition: A | Discharge status: Home / Self Care | Payer: 59 | Source: Ambulatory Visit

## 2012-01-23 DIAGNOSIS — Z780 Asymptomatic menopausal state: Secondary | ICD-10-CM

## 2012-02-09 ENCOUNTER — Encounter: Payer: Self-pay | Admitting: Internal Medicine

## 2012-02-13 ENCOUNTER — Encounter: Payer: Self-pay | Admitting: Internal Medicine

## 2012-02-13 ENCOUNTER — Telehealth: Payer: Self-pay | Admitting: Internal Medicine

## 2012-02-13 NOTE — Telephone Encounter (Signed)
Correction--routing to correct destination.

## 2012-02-13 NOTE — Telephone Encounter (Signed)
Barbara Callahan is calling to follow up Dr Jodie Echevaria advice about treating her insomnia. Says she was told to get Melatonin 3mg  but she was only able to find 5mg . Has taken it each night since Friday 1/24 but is still not able to sleep. She slept for 1.5 hrs last night.  Please let patient know what next step is since this does not seem to help?

## 2012-02-14 NOTE — Telephone Encounter (Signed)
Through my chart, I advise patient to try 10 mg melatonin.

## 2012-03-07 ENCOUNTER — Encounter: Payer: Self-pay | Admitting: Internal Medicine

## 2012-03-09 ENCOUNTER — Encounter: Payer: Self-pay | Admitting: Internal Medicine

## 2012-09-14 ENCOUNTER — Other Ambulatory Visit: Payer: Self-pay | Admitting: Internal Medicine

## 2012-10-15 ENCOUNTER — Other Ambulatory Visit: Payer: Self-pay | Admitting: Internal Medicine

## 2012-10-22 ENCOUNTER — Other Ambulatory Visit: Payer: Self-pay | Admitting: Obstetrics and Gynecology

## 2012-11-15 ENCOUNTER — Other Ambulatory Visit: Payer: Self-pay

## 2012-11-15 MED ORDER — NADOLOL 40 MG PO TABS: ORAL_TABLET | ORAL | Status: DC

## 2012-11-17 LAB — HM PAP SMEAR

## 2012-11-17 LAB — HM MAMMOGRAPHY

## 2012-11-20 ENCOUNTER — Encounter: Payer: Self-pay | Admitting: Internal Medicine

## 2012-11-20 ENCOUNTER — Ambulatory Visit (INDEPENDENT_AMBULATORY_CARE_PROVIDER_SITE_OTHER): Payer: 59 | Admitting: Internal Medicine

## 2012-11-20 VITALS — BP 152/96 | HR 69 | Ht 64.0 in | Wt 175.8 lb

## 2012-11-20 DIAGNOSIS — R000 Tachycardia, unspecified: Secondary | ICD-10-CM

## 2012-11-20 DIAGNOSIS — I10 Essential (primary) hypertension: Secondary | ICD-10-CM

## 2012-11-20 NOTE — Progress Notes (Signed)
      Patient Care Team: Newt Lukes, MD as PCP - General (Internal Medicine) Duke Salvia, MD (Cardiology) Levi Aland, MD (Obstetrics and Gynecology)   HPI  Barbara Callahan is a 62 y.o. female Seen in followup after a hiatus of more than 3 years. She had been seen for tachycardia not clearly sinus versus atrial but in the context of subsequently not confirmed biatrial enlargement. She also had hypertension and peripheral edema.  She underwent Myoview scanning 8/13 was normal  she had been seen for chest pain just prior to that was noted again to be hypertensive with a heart rate of over 100. There is no record in the [facility] computer as to urine studies  She has been treated with a beta blocker. She has had very few symptoms on Corgard. She tolerates it better than she did metoprolol. Blood pressures at home have been well controlled. Past Medical History  Diagnosis Date  . Chest pain     ETT-Myoview 08/18/11: No ischemia, EF 68%  . Hypertension     Echocardiogram 04/2009: EF 55-60%, grade 1 diastolic dysfunction  . Gastroesophageal reflux disease   . Anemia   . POTS (postural orthostatic tachycardia syndrome) 02/2009 dx    unspecified tachycardia    Past Surgical History  Procedure Laterality Date  . Tubal ligation    . Appendectomy  1998    Current Outpatient Prescriptions  Medication Sig Dispense Refill  . cyclobenzaprine (FLEXERIL) 10 MG tablet Take 10 mg by mouth daily as needed.        . ferrous sulfate (LONGS FERROUS SULFATE FE) 325 (65 FE) MG tablet Take 325 mg by mouth daily.        . nadolol (CORGARD) 40 MG tablet TAKE 1 TABLET BY MOUTH DAILY  30 tablet  2  . RABEprazole (ACIPHEX) 20 MG tablet Take 20 mg by mouth daily as needed. For reflux      . nitroGLYCERIN (NITROSTAT) 0.4 MG SL tablet Place 1 tablet (0.4 mg total) under the tongue every 5 (five) minutes as needed for chest pain.  25 tablet  3   No current facility-administered medications for this  visit.    No Known Allergies  Review of Systems negative except from HPI and PMH  Physical Exam BP 152/96  Pulse 69  Ht 5\' 4"  (1.626 m)  Wt 175 lb 12.8 oz (79.742 kg)  BMI 30.16 kg/m2 Well developed and nourished in no acute distress HENT normal Neck supple with JVP-flat Clear Regular rate and rhythm, no murmurs or gallops Abd-soft with active BS No Clubbing cyanosis edema Skin-warm and dry A & Oriented  Grossly normal sensory and motor function  ECG demonstrates sinus rhythm at 68 Intervals 15/09/39   Assessment and  Plan

## 2012-11-20 NOTE — Assessment & Plan Note (Signed)
Blood pressure was still elevated on repeat. She reports her blood pressure home and she is to see her PCP in the next few weeks. She will bring her blood pressure machine by for corroboration.

## 2012-11-20 NOTE — Patient Instructions (Signed)
We will see you back on an as needed basis.  Your physician recommends that you continue on your current medications as directed. Please refer to the Current Medication list given to you today.  

## 2012-11-20 NOTE — Assessment & Plan Note (Signed)
Tachycardia is well-controlled on her Corgard. We'll continue it as she needs something for her blood pressure. In the event that she needs further therapy would probably avoid a diuretic

## 2012-11-22 ENCOUNTER — Other Ambulatory Visit: Payer: Self-pay

## 2012-12-27 ENCOUNTER — Telehealth: Payer: Self-pay | Admitting: Internal Medicine

## 2012-12-27 NOTE — Telephone Encounter (Signed)
New message  Pt called--states that she was advised the Nadolol prescription was the cause of her irritated eyes, that has in result, affected her vision. She states that she has seised to take the medication for 2 weeks and feels a lot better in doing so// please call back to discuss.

## 2012-12-28 NOTE — Telephone Encounter (Signed)
Dr. Graciela Husbands made aware and ok to stop. Pt advised to call back if heart rate increases again. She is agreeable to plan.

## 2013-02-11 ENCOUNTER — Encounter: Payer: Self-pay | Admitting: Internal Medicine

## 2013-02-11 ENCOUNTER — Ambulatory Visit (INDEPENDENT_AMBULATORY_CARE_PROVIDER_SITE_OTHER): Payer: Self-pay | Admitting: Internal Medicine

## 2013-02-11 VITALS — BP 148/98 | HR 88 | Temp 97.7°F | Ht 64.0 in | Wt 176.4 lb

## 2013-02-11 DIAGNOSIS — I10 Essential (primary) hypertension: Secondary | ICD-10-CM

## 2013-02-11 DIAGNOSIS — Z0000 Encounter for general adult medical examination without abnormal findings: Secondary | ICD-10-CM

## 2013-02-11 DIAGNOSIS — K219 Gastro-esophageal reflux disease without esophagitis: Secondary | ICD-10-CM

## 2013-02-11 DIAGNOSIS — Z111 Encounter for screening for respiratory tuberculosis: Secondary | ICD-10-CM

## 2013-02-11 MED ORDER — CARVEDILOL PHOSPHATE ER 40 MG PO CP24: 40.0000 mg | ORAL_CAPSULE | Freq: Every day | ORAL | Status: DC

## 2013-02-11 MED ORDER — PANTOPRAZOLE SODIUM 40 MG PO TBEC: 40.0000 mg | DELAYED_RELEASE_TABLET | Freq: Every day | ORAL | Status: DC

## 2013-02-11 NOTE — Progress Notes (Signed)
Subjective:    Patient ID: Barbara Callahan, female    DOB: [birth year], 63 y.o.   MRN: [id number]  HPI  patient is here today for annual physical. Patient feels well and has no complaints.  Also reviewed chronic medical issues: hypertension, history of tachycardia - noncompliance with meds reviewed  Needs employment form complete  Past Medical History  Diagnosis Date  . Chest pain     ETT-Myoview 08/18/11: No ischemia, EF 68%  . Hypertension     Echocardiogram 04/2009: EF 65-78%, grade 1 diastolic dysfunction  . Gastroesophageal reflux disease   . Anemia   . POTS (postural orthostatic tachycardia syndrome) 02/2009 dx    unspecified tachycardia   Family History  Problem Relation Age of Onset  . Heart attack Father 30  . Breast cancer Mother 65  . Pancreatic cancer Maternal Grandmother    History  Substance Use Topics  . Smoking status: Never Smoker   . 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 Alcohol Use: No    Review of Systems  Constitutional: Negative for fatigue and unexpected weight change.  Respiratory: Negative for cough, shortness of breath and wheezing.   Cardiovascular: Positive for palpitations (worse with exertion). Negative for chest pain and leg swelling.  Gastrointestinal: Negative for nausea, abdominal pain and diarrhea.  Neurological: Negative for dizziness, weakness, light-headedness and headaches.  Psychiatric/Behavioral: Negative for dysphoric mood. The patient is not nervous/anxious.   All other systems reviewed and are negative.       Objective:   Physical Exam BP 148/98  Pulse 88  Temp(Src) 97.7 F (36.5 C) (Oral)  Ht 5\' 4"  (1.626 m)  Wt 176 lb 6.4 oz (80.015 kg)  BMI 30.26 kg/m2  SpO2 99% Wt Readings from Last 3 Encounters:  02/11/13 176 lb 6.4 oz (80.015 kg)  11/20/12 175 lb 12.8 oz (79.742 kg)  12/23/11 176 lb (79.833 kg)   Constitutional: She appears well-developed and  well-nourished. No distress.  HENT: Head: Normocephalic and atraumatic. Ears: B TMs ok, no erythema or effusion; Nose: Nose normal. Mouth/Throat: Oropharynx is clear and moist. No oropharyngeal exudate.  Eyes: Conjunctivae and EOM are normal. Pupils are equal, round, and reactive to light. No scleral icterus.  Neck: Normal range of motion. Neck supple. No JVD present. No thyromegaly present.  Cardiovascular: Normal rate, regular rhythm and normal heart sounds.  No murmur heard. No BLE edema. Pulmonary/Chest: Effort normal and breath sounds normal. No respiratory distress. She has no wheezes.  Abdominal: Soft. Bowel sounds are normal. She exhibits no distension. There is no tenderness. no masses Musculoskeletal: Normal range of motion, no joint effusions. No gross deformities Neurological: She is alert and oriented to person, place, and time. No cranial nerve deficit. Coordination, balance, strength, speech and gait are normal.  Skin: Skin is warm and dry. No rash noted. No erythema.  Psychiatric: She has a normal mood and affect. Her behavior is normal. Judgment and thought content normal.   Lab Results  Component Value Date   WBC 5.5 12/23/2011   HGB 12.8 12/23/2011   HCT 39.6 12/23/2011   PLT 254.0 12/23/2011   GLUCOSE 106* 12/23/2011   CHOL 206* 12/23/2011   TRIG 77.0 12/23/2011   HDL 50.50 12/23/2011   LDLDIRECT 149.7 12/23/2011   LDLCALC  Value: 121        Total Cholesterol/HDL:CHD Risk Coronary Heart Disease Risk Table  Men   Women  1/2 Average Risk   3.4   3.3  Average Risk       5.0   4.4  2 X Average Risk   9.6   7.1  3 X Average Risk  23.4   11.0        Use the calculated Patient Ratio above and the CHD Risk Table to determine the patient's CHD Risk.        ATP III CLASSIFICATION (LDL):  <100     mg/dL   Optimal  100-129  mg/dL   Near or Above                    Optimal  130-159  mg/dL   Borderline  160-189  mg/dL   High  >190     mg/dL   Very High* 03/14/2009   ALT 34  12/23/2011   AST 28 12/23/2011   NA 140 12/23/2011   K 4.1 12/23/2011   CL 103 12/23/2011   CREATININE 0.9 12/23/2011   BUN 20 12/23/2011   CO2 27 12/23/2011   TSH 0.83 12/23/2011   INR 1.03 05/31/2009        Assessment & Plan:   CPX/v70.0 - Patient has been counseled on age-appropriate routine health concerns for screening and prevention. These are reviewed and up-to-date. Immunizations are up-to-date or declined. Labs ordered and reviewed.  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 Medications and labs reviewed today.

## 2013-02-11 NOTE — Patient Instructions (Addendum)
It was good to see you today.  We have reviewed your prior records including labs and tests today  Health Maintenance reviewed - all recommended immunizations and age-appropriate screenings are up-to-date.  Test(s) ordered today. Your results will be released to [place] (or called to you) after review, usually within 72hours after test completion. If any changes need to be made, you will be notified at that same time.  Medications reviewed and updated Change nadolol to extended release Coreg once daily for heart rate and blood pressure control. Change AcipHex to Protonix for indigestion once daily No other changes recommended  Your prescription(s) have been submitted to your pharmacy. Please take as directed and contact our office if you believe you are having problem(s) with the medication(s).  Please schedule followup in 3 month for blood pressure check, call sooner if problems.  Health Maintenance, Female A healthy lifestyle and preventative care can promote health and wellness.  Maintain regular health, dental, and eye exams.  Eat a healthy diet. Foods like vegetables, fruits, whole grains, low-fat dairy products, and lean protein foods contain the nutrients you need without too many calories. Decrease your intake of foods high in solid fats, added sugars, and salt. Get information about a proper diet from your caregiver, if necessary.  Regular physical exercise is one of the most important things you can do for your health. Most adults should get at least 150 minutes of moderate-intensity exercise (any activity that increases your heart rate and causes you to sweat) each week. In addition, most adults need muscle-strengthening exercises on 2 or more days a week.   Maintain a healthy weight. The body mass index (BMI) is a screening tool to identify possible weight problems. It provides an estimate of body fat based on height and weight. Your caregiver can help determine your BMI, and  can help you achieve or maintain a healthy weight. For adults 20 years and older:  A BMI below 18.5 is considered underweight.  A BMI of 18.5 to 24.9 is normal.  A BMI of 25 to 29.9 is considered overweight.  A BMI of 30 and above is considered obese.  Maintain normal blood lipids and cholesterol by exercising and minimizing your intake of saturated fat. Eat a balanced diet with plenty of fruits and vegetables. Blood tests for lipids and cholesterol should begin at age 77 and be repeated every 5 years. If your lipid or cholesterol levels are high, you are over 50, or you are a high risk for heart disease, you may need your cholesterol levels checked more frequently.Ongoing high lipid and cholesterol levels should be treated with medicines if diet and exercise are not effective.  If you smoke, find out from your caregiver how to quit. If you do not use tobacco, do not start.  Lung cancer screening is recommended for adults aged 73 80 years who are at high risk for developing lung cancer because of a history of smoking. Yearly low-dose computed tomography (CT) is recommended for people who have at least a 30-pack-year history of smoking and are a current smoker or have quit within the past 15 years. A pack year of smoking is smoking an average of 1 pack of cigarettes a day for 1 year (for example: 1 pack a day for 30 years or 2 packs a day for 15 years). Yearly screening should continue until the smoker has stopped smoking for at least 15 years. Yearly screening should also be stopped for people who develop a health problem  that would prevent them from having lung cancer treatment.  If you are pregnant, do not drink alcohol. If you are breastfeeding, be very cautious about drinking alcohol. If you are not pregnant and choose to drink alcohol, do not exceed 1 drink per day. One drink is considered to be 12 ounces (355 mL) of beer, 5 ounces (148 mL) of wine, or 1.5 ounces (44 mL) of liquor.  Avoid use  of street drugs. Do not share needles with anyone. Ask for help if you need support or instructions about stopping the use of drugs.  High blood pressure causes heart disease and increases the risk of stroke. Blood pressure should be checked at least every 1 to 2 years. Ongoing high blood pressure should be treated with medicines, if weight loss and exercise are not effective.  If you are 37 to 62 years old, ask your caregiver if you should take aspirin to prevent strokes.  Diabetes screening involves taking a blood sample to check your fasting blood sugar level. This should be done once every 3 years, after age 22, if you are within normal weight and without risk factors for diabetes. Testing should be considered at a younger age or be carried out more frequently if you are overweight and have at least 1 risk factor for diabetes.  Breast cancer screening is essential preventative care for women. You should practice "breast self-awareness." This means understanding the normal appearance and feel of your breasts and may include breast self-examination. Any changes detected, no matter how small, should be reported to a caregiver. Women in their 53s and 30s should have a clinical breast exam (CBE) by a caregiver as part of a regular health exam every 1 to 3 years. After age 22, women should have a CBE every year. Starting at age 41, women should consider having a mammogram (breast X-ray) every year. Women who have a family history of breast cancer should talk to their caregiver about genetic screening. Women at a high risk of breast cancer should talk to their caregiver about having an MRI and a mammogram every year.  Breast cancer gene (BRCA)-related cancer risk assessment is recommended for women who have family members with BRCA-related cancers. BRCA-related cancers include breast, ovarian, tubal, and peritoneal cancers. Having family members with these cancers may be associated with an increased risk for  harmful changes (mutations) in the breast cancer genes BRCA1 and BRCA2. Results of the assessment will determine the need for genetic counseling and BRCA1 and BRCA2 testing.  The Pap test is a screening test for cervical cancer. Women should have a Pap test starting at age 61. Between ages 11 and 28, Pap tests should be repeated every 2 years. Beginning at age 52, you should have a Pap test every 3 years as long as the past 3 Pap tests have been normal. If you had a hysterectomy for a problem that was not cancer or a condition that could lead to cancer, then you no longer need Pap tests. If you are between ages 69 and 30, and you have had normal Pap tests going back 10 years, you no longer need Pap tests. If you have had past treatment for cervical cancer or a condition that could lead to cancer, you need Pap tests and screening for cancer for at least 20 years after your treatment. If Pap tests have been discontinued, risk factors (such as a new sexual partner) need to be reassessed to determine if screening should be resumed. Some women  have medical problems that increase the chance of getting cervical cancer. In these cases, your caregiver may recommend more frequent screening and Pap tests.  The human papillomavirus (HPV) test is an additional test that may be used for cervical cancer screening. The HPV test looks for the virus that can cause the cell changes on the cervix. The cells collected during the Pap test can be tested for HPV. The HPV test could be used to screen women aged 16 years and older, and should be used in women of any age who have unclear Pap test results. After the age of 60, women should have HPV testing at the same frequency as a Pap test.  Colorectal cancer can be detected and often prevented. Most routine colorectal cancer screening begins at the age of 57 and continues through age 21. However, your caregiver may recommend screening at an earlier age if you have risk factors for  colon cancer. On a yearly basis, your caregiver may provide home test kits to check for hidden blood in the stool. Use of a small camera at the end of a tube, to directly examine the colon (sigmoidoscopy or colonoscopy), can detect the earliest forms of colorectal cancer. Talk to your caregiver about this at age 54, when routine screening begins. Direct examination of the colon should be repeated every 5 to 10 years through age 27, unless early forms of pre-cancerous polyps or small growths are found.  Hepatitis C blood testing is recommended for all people born from [birth year] through 1965 and any individual with known risks for hepatitis C.  Practice safe sex. Use condoms and avoid high-risk sexual practices to reduce the spread of sexually transmitted infections (STIs). Sexually active women aged 23 and younger should be checked for Chlamydia, which is a common sexually transmitted infection. Older women with new or multiple partners should also be tested for Chlamydia. Testing for other STIs is recommended if you are sexually active and at increased risk.  Osteoporosis is a disease in which the bones lose minerals and strength with aging. This can result in serious bone fractures. The risk of osteoporosis can be identified using a bone density scan. Women ages 33 and over and women at risk for fractures or osteoporosis should discuss screening with their caregivers. Ask your caregiver whether you should be taking a calcium supplement or vitamin D to reduce the rate of osteoporosis.  Menopause can be associated with physical symptoms and risks. Hormone replacement therapy is available to decrease symptoms and risks. You should talk to your caregiver about whether hormone replacement therapy is right for you.  Use sunscreen. Apply sunscreen liberally and repeatedly throughout the day. You should seek shade when your shadow is shorter than you. Protect yourself by wearing long sleeves, pants, a wide-brimmed  hat, and sunglasses year round, whenever you are outdoors.  Notify your caregiver of new moles or changes in moles, especially if there is a change in shape or color. Also notify your caregiver if a mole is larger than the size of a pencil eraser.  Stay current with your immunizations. Document Released: 07/19/2010 Document Revised: 04/30/2012 Document Reviewed: 07/19/2010 [facility] Patient Information 2014 [place]. Hypertension Hypertension is another name for high blood pressure. High blood pressure may mean that your heart needs to work harder to pump blood. Blood pressure consists of two numbers, which includes a higher number over a lower number (example: 110/72). HOME CARE   Make lifestyle changes as told by your doctor. This may  include weight loss and exercise.  Take your blood pressure medicine every day.  Limit how much salt you use.  Stop smoking if you smoke.  Do not use drugs.  Talk to your doctor if you are using decongestants or birth control pills. These medicines might make blood pressure higher.  Females should not drink more than 1 alcoholic drink per day. Males should not drink more than 2 alcoholic drinks per day.  See your doctor as told. GET HELP RIGHT AWAY IF:   You have a blood pressure reading with a top number of 180 or higher.  You get a very bad headache.  You get blurred or changing vision.  You feel confused.  You feel weak, numb, or faint.  You get chest or belly (abdominal) pain.  You throw up (vomit).  You cannot breathe very well. MAKE SURE YOU:   Understand these instructions.  Will watch your condition.  Will get help right away if you are not doing well or get worse. Document Released: 06/22/2007 Document Revised: 03/28/2011 Document Reviewed: 06/22/2007 [facility] Patient Information 2014 [place], [place].

## 2013-02-11 NOTE — Assessment & Plan Note (Signed)
Change to formulary PPI - erx done

## 2013-02-11 NOTE — Progress Notes (Signed)
Pre-visit discussion using our clinic review tool. No additional management support is needed unless otherwise documented below in the visit note.  

## 2013-02-11 NOTE — Assessment & Plan Note (Signed)
BP Readings from Last 3 Encounters:  02/11/13 148/98  11/20/12 152/96  12/23/11 172/100   uncontrolled Did not make change to nonselective beta-blocker as recommended 08/2011 by EP cards Reports noncompliance with Nadolol due to "eye problem" - ?xeropth Change now to carvedilol - erx for XR done we reviewed potential risk/benefit and possible side effects - pt understands and agrees to same  Patient agrees to call if problems or if uncontrolled blood pressure/heart rate at home (hx POTS, currently asymptomatic)

## 2013-02-13 ENCOUNTER — Encounter: Payer: Self-pay | Admitting: *Deleted

## 2013-02-13 LAB — TB SKIN TEST
INDURATION: 0 mm
TB SKIN TEST: NEGATIVE

## 2014-01-06 ENCOUNTER — Emergency Department (HOSPITAL_BASED_OUTPATIENT_CLINIC_OR_DEPARTMENT_OTHER)
Admission: EM | Admit: 2014-01-06 | Discharge: 2014-01-06 | Disposition: A | Discharge status: Home / Self Care | Payer: No Typology Code available for payment source | Attending: Emergency Medicine | Admitting: Emergency Medicine

## 2014-01-06 ENCOUNTER — Encounter (HOSPITAL_BASED_OUTPATIENT_CLINIC_OR_DEPARTMENT_OTHER): Payer: Self-pay | Admitting: *Deleted

## 2014-01-06 ENCOUNTER — Emergency Department (HOSPITAL_BASED_OUTPATIENT_CLINIC_OR_DEPARTMENT_OTHER): Payer: No Typology Code available for payment source

## 2014-01-06 DIAGNOSIS — K219 Gastro-esophageal reflux disease without esophagitis: Secondary | ICD-10-CM | POA: Diagnosis not present

## 2014-01-06 DIAGNOSIS — S161XXA Strain of muscle, fascia and tendon at neck level, initial encounter: Secondary | ICD-10-CM | POA: Diagnosis not present

## 2014-01-06 DIAGNOSIS — Y998 Other external cause status: Secondary | ICD-10-CM | POA: Diagnosis not present

## 2014-01-06 DIAGNOSIS — I10 Essential (primary) hypertension: Secondary | ICD-10-CM | POA: Diagnosis not present

## 2014-01-06 DIAGNOSIS — Y9389 Activity, other specified: Secondary | ICD-10-CM | POA: Diagnosis not present

## 2014-01-06 DIAGNOSIS — D649 Anemia, unspecified: Secondary | ICD-10-CM | POA: Insufficient documentation

## 2014-01-06 DIAGNOSIS — S199XXA Unspecified injury of neck, initial encounter: Secondary | ICD-10-CM | POA: Diagnosis present

## 2014-01-06 DIAGNOSIS — Z79899 Other long term (current) drug therapy: Secondary | ICD-10-CM | POA: Diagnosis not present

## 2014-01-06 DIAGNOSIS — S299XXA Unspecified injury of thorax, initial encounter: Secondary | ICD-10-CM | POA: Insufficient documentation

## 2014-01-06 DIAGNOSIS — G4489 Other headache syndrome: Secondary | ICD-10-CM | POA: Diagnosis not present

## 2014-01-06 DIAGNOSIS — R000 Tachycardia, unspecified: Secondary | ICD-10-CM | POA: Insufficient documentation

## 2014-01-06 DIAGNOSIS — Y92410 Unspecified street and highway as the place of occurrence of the external cause: Secondary | ICD-10-CM | POA: Diagnosis not present

## 2014-01-06 MED ORDER — CYCLOBENZAPRINE HCL 10 MG PO TABS: 10.0000 mg | ORAL_TABLET | Freq: Every day | ORAL | Status: DC

## 2014-01-06 MED ORDER — HYDROCODONE-ACETAMINOPHEN 5-325 MG PO TABS: 1.0000 | ORAL_TABLET | Freq: Four times a day (QID) | ORAL | Status: DC | PRN

## 2014-01-06 MED ORDER — IBUPROFEN 800 MG PO TABS
800.0000 mg | ORAL_TABLET | Freq: Once | ORAL | Status: AC
  Administered 2014-01-06: 800 mg via ORAL
  Filled 2014-01-06: qty 1

## 2014-01-06 MED ORDER — IBUPROFEN 800 MG PO TABS: 800.0000 mg | ORAL_TABLET | Freq: Three times a day (TID) | ORAL | Status: DC
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
## 2014-01-06 NOTE — Discharge Instructions (Signed)
Call for a follow up appointment with a Family or Primary Care Provider.  Return if Symptoms worsen.   Take medication as prescribed.  Ice your neck 3-4 times a day. Ibuprofen 800 mg for pain. Take narcotic pain medication only for breakthrough pain.
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
## 2016-03-15 ENCOUNTER — Other Ambulatory Visit: Payer: Self-pay | Admitting: Obstetrics and Gynecology

## 2016-03-16 LAB — CYTOLOGY - PAP

## 2016-11-21 DIAGNOSIS — H40013 Open angle with borderline findings, low risk, bilateral: Secondary | ICD-10-CM | POA: Diagnosis not present

## 2016-11-21 DIAGNOSIS — H5203 Hypermetropia, bilateral: Secondary | ICD-10-CM | POA: Diagnosis not present

## 2016-11-21 DIAGNOSIS — H524 Presbyopia: Secondary | ICD-10-CM | POA: Diagnosis not present

## 2016-11-21 DIAGNOSIS — H40019 Open angle with borderline findings, low risk, unspecified eye: Secondary | ICD-10-CM | POA: Diagnosis not present

## 2017-06-07 DIAGNOSIS — H40013 Open angle with borderline findings, low risk, bilateral: Secondary | ICD-10-CM | POA: Diagnosis not present

## 2017-06-07 DIAGNOSIS — H5203 Hypermetropia, bilateral: Secondary | ICD-10-CM | POA: Diagnosis not present

## 2017-06-07 DIAGNOSIS — H524 Presbyopia: Secondary | ICD-10-CM | POA: Diagnosis not present

## 2017-11-27 DIAGNOSIS — H40013 Open angle with borderline findings, low risk, bilateral: Secondary | ICD-10-CM | POA: Diagnosis not present

## 2017-11-27 DIAGNOSIS — H524 Presbyopia: Secondary | ICD-10-CM | POA: Diagnosis not present

## 2017-11-27 DIAGNOSIS — H5203 Hypermetropia, bilateral: Secondary | ICD-10-CM | POA: Diagnosis not present

## 2017-11-28 DIAGNOSIS — H52223 Regular astigmatism, bilateral: Secondary | ICD-10-CM | POA: Diagnosis not present

## 2017-11-28 DIAGNOSIS — H524 Presbyopia: Secondary | ICD-10-CM | POA: Diagnosis not present

## 2017-11-30 DIAGNOSIS — H5203 Hypermetropia, bilateral: Secondary | ICD-10-CM | POA: Diagnosis not present

## 2017-11-30 DIAGNOSIS — H40013 Open angle with borderline findings, low risk, bilateral: Secondary | ICD-10-CM | POA: Diagnosis not present

## 2017-11-30 DIAGNOSIS — H40021 Open angle with borderline findings, high risk, right eye: Secondary | ICD-10-CM | POA: Diagnosis not present

## 2017-11-30 DIAGNOSIS — H524 Presbyopia: Secondary | ICD-10-CM | POA: Diagnosis not present

## 2017-12-08 DIAGNOSIS — H40021 Open angle with borderline findings, high risk, right eye: Secondary | ICD-10-CM | POA: Diagnosis not present

## 2018-07-16 DIAGNOSIS — H401111 Primary open-angle glaucoma, right eye, mild stage: Secondary | ICD-10-CM | POA: Diagnosis not present

## 2018-08-16 ENCOUNTER — Other Ambulatory Visit: Payer: Self-pay

## 2018-08-16 ENCOUNTER — Emergency Department (HOSPITAL_COMMUNITY)
Admission: EM | Admit: 2018-08-16 | Discharge: 2018-08-17 | Disposition: A | Discharge status: Home / Self Care | Payer: Medicare HMO | Attending: Emergency Medicine | Admitting: Emergency Medicine

## 2018-08-16 ENCOUNTER — Emergency Department (HOSPITAL_COMMUNITY): Payer: Medicare HMO

## 2018-08-16 ENCOUNTER — Encounter (HOSPITAL_COMMUNITY): Payer: Self-pay

## 2018-08-16 DIAGNOSIS — M542 Cervicalgia: Secondary | ICD-10-CM | POA: Insufficient documentation

## 2018-08-16 DIAGNOSIS — R51 Headache: Secondary | ICD-10-CM | POA: Diagnosis not present

## 2018-08-16 DIAGNOSIS — I10 Essential (primary) hypertension: Secondary | ICD-10-CM | POA: Insufficient documentation

## 2018-08-16 DIAGNOSIS — H532 Diplopia: Secondary | ICD-10-CM | POA: Diagnosis not present

## 2018-08-16 DIAGNOSIS — Z79899 Other long term (current) drug therapy: Secondary | ICD-10-CM | POA: Diagnosis not present

## 2018-08-16 DIAGNOSIS — S0990XA Unspecified injury of head, initial encounter: Secondary | ICD-10-CM | POA: Diagnosis not present

## 2018-08-16 DIAGNOSIS — H538 Other visual disturbances: Secondary | ICD-10-CM | POA: Diagnosis not present

## 2018-08-16 DIAGNOSIS — S199XXA Unspecified injury of neck, initial encounter: Secondary | ICD-10-CM | POA: Diagnosis not present

## 2018-08-16 DIAGNOSIS — R079 Chest pain, unspecified: Secondary | ICD-10-CM | POA: Diagnosis not present

## 2018-08-16 DIAGNOSIS — S299XXA Unspecified injury of thorax, initial encounter: Secondary | ICD-10-CM | POA: Diagnosis not present

## 2018-08-16 LAB — I-STAT CHEM 8, ED
BUN: 15 mg/dL (ref 8–23)
Calcium, Ion: 1.17 mmol/L (ref 1.15–1.40)
Chloride: 105 mmol/L (ref 98–111)
Creatinine, Ser: 0.9 mg/dL (ref 0.44–1.00)
Glucose, Bld: 107 mg/dL — ABNORMAL HIGH (ref 70–99)
HCT: 41 % (ref 36.0–46.0)
Hemoglobin: 13.9 g/dL (ref 12.0–15.0)
Potassium: 4 mmol/L (ref 3.5–5.1)
Sodium: 142 mmol/L (ref 135–145)
TCO2: 26 mmol/L (ref 22–32)

## 2018-08-16 LAB — CBC
HCT: 38.3 % (ref 36.0–46.0)
Hemoglobin: 12 g/dL (ref 12.0–15.0)
MCH: 27.1 pg (ref 26.0–34.0)
MCHC: 31.3 g/dL (ref 30.0–36.0)
MCV: 86.7 fL (ref 80.0–100.0)
Platelets: 221 10*3/uL (ref 150–400)
RBC: 4.42 MIL/uL (ref 3.87–5.11)
RDW: 13.9 % (ref 11.5–15.5)
WBC: 5.6 10*3/uL (ref 4.0–10.5)
nRBC: 0 % (ref 0.0–0.2)

## 2018-08-16 LAB — CBG MONITORING, ED: Glucose-Capillary: 98 mg/dL (ref 70–99)

## 2018-08-16 MED ORDER — IOHEXOL 350 MG/ML SOLN
75.0000 mL | Freq: Once | INTRAVENOUS | Status: AC | PRN
  Administered 2018-08-16: 22:00:00 75 mL via INTRAVENOUS

## 2018-08-16 MED ORDER — SODIUM CHLORIDE 0.9% FLUSH
10.0000 mL | Freq: Two times a day (BID) | INTRAVENOUS | Status: DC
  Administered 2018-08-16: 10 mL

## 2018-08-16 MED ORDER — SODIUM CHLORIDE 0.9% FLUSH: 10.0000 mL | INTRAVENOUS | Status: DC | PRN

## 2018-08-16 NOTE — ED Notes (Signed)
Patient transported to CT 

## 2018-08-16 NOTE — ED Notes (Signed)
Per tech RN to draw blood off IV

## 2018-08-16 NOTE — Discharge Instructions (Signed)
You have some irregularities in your carotids.  Follow-up with neurology either at the [facility] or [facility] neurology.  Your blood pressure was elevated and needs to be followed more closely.

## 2018-08-16 NOTE — ED Notes (Signed)
IV team at bedside 

## 2018-08-16 NOTE — ED Notes (Signed)
Attempted to obtain IV x2, second RN attempted. IV team consult placed
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
## 2018-08-16 NOTE — ED Provider Notes (Signed)
Plan at signout if to f/u on CT imaging If negative, reassess patient for diplopia    Ripley Fraise, MD 08/16/18 2309
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
## 2018-08-28 ENCOUNTER — Encounter: Payer: Self-pay | Admitting: Diagnostic Neuroimaging

## 2018-08-28 ENCOUNTER — Ambulatory Visit (INDEPENDENT_AMBULATORY_CARE_PROVIDER_SITE_OTHER): Payer: Medicare HMO | Admitting: Diagnostic Neuroimaging

## 2018-08-28 ENCOUNTER — Other Ambulatory Visit: Payer: Self-pay

## 2018-08-28 VITALS — BP 177/85 | HR 77 | Temp 98.5°F | Ht 63.0 in | Wt 206.6 lb

## 2018-08-28 DIAGNOSIS — G44319 Acute post-traumatic headache, not intractable: Secondary | ICD-10-CM | POA: Diagnosis not present

## 2018-08-28 MED ORDER — AMITRIPTYLINE HCL 10 MG PO TABS: 10.0000 mg | ORAL_TABLET | Freq: Every day | ORAL | 3 refills | Status: AC

## 2018-08-28 NOTE — Patient Instructions (Signed)
  POST-TRAUMATIC HEADACHE - start amitriptyline 10mg  at bedtime x 2-4 weeks - continue tylenol, ibuprofen, aleve, excedrin migraine as needed  HYPERTENSION - continue coreg - follow up with PCP
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
## 2018-10-12 DIAGNOSIS — R635 Abnormal weight gain: Secondary | ICD-10-CM | POA: Diagnosis not present

## 2018-10-12 DIAGNOSIS — Z833 Family history of diabetes mellitus: Secondary | ICD-10-CM | POA: Diagnosis not present

## 2018-10-12 DIAGNOSIS — R000 Tachycardia, unspecified: Secondary | ICD-10-CM | POA: Diagnosis not present

## 2018-10-12 DIAGNOSIS — I10 Essential (primary) hypertension: Secondary | ICD-10-CM | POA: Diagnosis not present

## 2018-11-13 DIAGNOSIS — I10 Essential (primary) hypertension: Secondary | ICD-10-CM | POA: Diagnosis not present

## 2018-11-13 DIAGNOSIS — Z0000 Encounter for general adult medical examination without abnormal findings: Secondary | ICD-10-CM | POA: Diagnosis not present

## 2018-11-20 DIAGNOSIS — R7309 Other abnormal glucose: Secondary | ICD-10-CM | POA: Diagnosis not present

## 2018-12-05 DIAGNOSIS — I10 Essential (primary) hypertension: Secondary | ICD-10-CM | POA: Diagnosis not present

## 2018-12-05 DIAGNOSIS — E669 Obesity, unspecified: Secondary | ICD-10-CM | POA: Diagnosis not present

## 2018-12-05 DIAGNOSIS — E785 Hyperlipidemia, unspecified: Secondary | ICD-10-CM | POA: Diagnosis not present

## 2018-12-24 DIAGNOSIS — E669 Obesity, unspecified: Secondary | ICD-10-CM | POA: Diagnosis not present

## 2018-12-24 DIAGNOSIS — E782 Mixed hyperlipidemia: Secondary | ICD-10-CM | POA: Diagnosis not present

## 2018-12-24 DIAGNOSIS — R000 Tachycardia, unspecified: Secondary | ICD-10-CM | POA: Diagnosis not present

## 2018-12-24 DIAGNOSIS — I10 Essential (primary) hypertension: Secondary | ICD-10-CM | POA: Diagnosis not present

## 2019-01-24 DIAGNOSIS — H40021 Open angle with borderline findings, high risk, right eye: Secondary | ICD-10-CM | POA: Diagnosis not present

## 2019-01-24 DIAGNOSIS — H02889 Meibomian gland dysfunction of unspecified eye, unspecified eyelid: Secondary | ICD-10-CM | POA: Diagnosis not present

## 2019-02-11 DIAGNOSIS — I10 Essential (primary) hypertension: Secondary | ICD-10-CM | POA: Diagnosis not present

## 2019-02-11 DIAGNOSIS — E669 Obesity, unspecified: Secondary | ICD-10-CM | POA: Diagnosis not present

## 2019-02-11 DIAGNOSIS — E782 Mixed hyperlipidemia: Secondary | ICD-10-CM | POA: Diagnosis not present

## 2019-02-11 DIAGNOSIS — M5432 Sciatica, left side: Secondary | ICD-10-CM | POA: Diagnosis not present

## 2019-03-11 DIAGNOSIS — M5432 Sciatica, left side: Secondary | ICD-10-CM | POA: Diagnosis not present

## 2019-03-11 DIAGNOSIS — E782 Mixed hyperlipidemia: Secondary | ICD-10-CM | POA: Diagnosis not present

## 2019-03-11 DIAGNOSIS — I10 Essential (primary) hypertension: Secondary | ICD-10-CM | POA: Diagnosis not present

## 2019-03-11 DIAGNOSIS — E785 Hyperlipidemia, unspecified: Secondary | ICD-10-CM | POA: Diagnosis not present

## 2019-03-21 DIAGNOSIS — H02889 Meibomian gland dysfunction of unspecified eye, unspecified eyelid: Secondary | ICD-10-CM | POA: Diagnosis not present

## 2019-03-21 DIAGNOSIS — H40021 Open angle with borderline findings, high risk, right eye: Secondary | ICD-10-CM | POA: Diagnosis not present

## 2019-07-25 ENCOUNTER — Encounter (HOSPITAL_BASED_OUTPATIENT_CLINIC_OR_DEPARTMENT_OTHER): Payer: Self-pay | Admitting: *Deleted

## 2019-07-25 ENCOUNTER — Other Ambulatory Visit: Payer: Self-pay

## 2019-07-25 ENCOUNTER — Emergency Department (HOSPITAL_BASED_OUTPATIENT_CLINIC_OR_DEPARTMENT_OTHER)
Admission: EM | Admit: 2019-07-25 | Discharge: 2019-07-25 | Disposition: A | Discharge status: Home / Self Care | Payer: No Typology Code available for payment source | Attending: Emergency Medicine | Admitting: Emergency Medicine

## 2019-07-25 DIAGNOSIS — Y939 Activity, unspecified: Secondary | ICD-10-CM | POA: Diagnosis not present

## 2019-07-25 DIAGNOSIS — Y999 Unspecified external cause status: Secondary | ICD-10-CM | POA: Diagnosis not present

## 2019-07-25 DIAGNOSIS — T63441A Toxic effect of venom of bees, accidental (unintentional), initial encounter: Secondary | ICD-10-CM | POA: Diagnosis not present

## 2019-07-25 DIAGNOSIS — W57XXXA Bitten or stung by nonvenomous insect and other nonvenomous arthropods, initial encounter: Secondary | ICD-10-CM | POA: Insufficient documentation

## 2019-07-25 DIAGNOSIS — Z7982 Long term (current) use of aspirin: Secondary | ICD-10-CM | POA: Diagnosis not present

## 2019-07-25 DIAGNOSIS — Y929 Unspecified place or not applicable: Secondary | ICD-10-CM | POA: Insufficient documentation

## 2019-07-25 DIAGNOSIS — Z79899 Other long term (current) drug therapy: Secondary | ICD-10-CM | POA: Insufficient documentation

## 2019-07-25 DIAGNOSIS — I10 Essential (primary) hypertension: Secondary | ICD-10-CM | POA: Insufficient documentation

## 2019-07-25 DIAGNOSIS — S80862A Insect bite (nonvenomous), left lower leg, initial encounter: Secondary | ICD-10-CM | POA: Insufficient documentation

## 2019-07-25 DIAGNOSIS — T63444A Toxic effect of venom of bees, undetermined, initial encounter: Secondary | ICD-10-CM

## 2019-07-25 MED ORDER — DIPHENHYDRAMINE HCL 25 MG PO CAPS
25.0000 mg | ORAL_CAPSULE | Freq: Once | ORAL | Status: AC
  Administered 2019-07-25: 25 mg via ORAL
  Filled 2019-07-25: qty 1

## 2019-07-25 NOTE — Discharge Instructions (Addendum)
Continue Benadryl for itching and pain

## 2019-07-25 NOTE — ED Triage Notes (Signed)
Bee sting to the back of her left upper leg 20 minutes ago. States she can't see to pull the stinger out of her skin.
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
## 2019-09-17 DIAGNOSIS — R278 Other lack of coordination: Secondary | ICD-10-CM | POA: Diagnosis not present

## 2019-09-17 DIAGNOSIS — N3946 Mixed incontinence: Secondary | ICD-10-CM | POA: Diagnosis not present

## 2019-09-17 DIAGNOSIS — M6289 Other specified disorders of muscle: Secondary | ICD-10-CM | POA: Diagnosis not present

## 2020-02-11 DIAGNOSIS — Z20822 Contact with and (suspected) exposure to covid-19: Secondary | ICD-10-CM | POA: Diagnosis not present

## 2020-12-24 DIAGNOSIS — C541 Malignant neoplasm of endometrium: Secondary | ICD-10-CM | POA: Diagnosis not present

## 2020-12-24 DIAGNOSIS — C539 Malignant neoplasm of cervix uteri, unspecified: Secondary | ICD-10-CM | POA: Diagnosis not present

## 2021-01-25 DIAGNOSIS — Z90722 Acquired absence of ovaries, bilateral: Secondary | ICD-10-CM | POA: Diagnosis not present

## 2021-01-25 DIAGNOSIS — Z8542 Personal history of malignant neoplasm of other parts of uterus: Secondary | ICD-10-CM | POA: Diagnosis not present

## 2021-01-25 DIAGNOSIS — Z08 Encounter for follow-up examination after completed treatment for malignant neoplasm: Secondary | ICD-10-CM | POA: Diagnosis not present

## 2021-01-25 DIAGNOSIS — Z9079 Acquired absence of other genital organ(s): Secondary | ICD-10-CM | POA: Diagnosis not present

## 2021-01-25 DIAGNOSIS — C541 Malignant neoplasm of endometrium: Secondary | ICD-10-CM | POA: Diagnosis not present

## 2021-01-25 DIAGNOSIS — Z90710 Acquired absence of both cervix and uterus: Secondary | ICD-10-CM | POA: Diagnosis not present

## 2021-04-26 DIAGNOSIS — N2889 Other specified disorders of kidney and ureter: Secondary | ICD-10-CM | POA: Diagnosis not present

## 2021-04-26 DIAGNOSIS — R918 Other nonspecific abnormal finding of lung field: Secondary | ICD-10-CM | POA: Diagnosis not present

## 2021-04-26 DIAGNOSIS — Z9079 Acquired absence of other genital organ(s): Secondary | ICD-10-CM | POA: Diagnosis not present

## 2021-04-26 DIAGNOSIS — N281 Cyst of kidney, acquired: Secondary | ICD-10-CM | POA: Diagnosis not present

## 2021-04-26 DIAGNOSIS — Z8542 Personal history of malignant neoplasm of other parts of uterus: Secondary | ICD-10-CM | POA: Diagnosis not present

## 2021-04-26 DIAGNOSIS — R9341 Abnormal radiologic findings on diagnostic imaging of renal pelvis, ureter, or bladder: Secondary | ICD-10-CM | POA: Diagnosis not present

## 2021-04-26 DIAGNOSIS — Z90710 Acquired absence of both cervix and uterus: Secondary | ICD-10-CM | POA: Diagnosis not present

## 2021-04-26 DIAGNOSIS — C541 Malignant neoplasm of endometrium: Secondary | ICD-10-CM | POA: Diagnosis not present

## 2021-04-26 DIAGNOSIS — Z90722 Acquired absence of ovaries, bilateral: Secondary | ICD-10-CM | POA: Diagnosis not present

## 2022-03-03 DIAGNOSIS — N6313 Unspecified lump in the right breast, lower outer quadrant: Secondary | ICD-10-CM | POA: Diagnosis not present

## 2022-04-27 DIAGNOSIS — C541 Malignant neoplasm of endometrium: Secondary | ICD-10-CM | POA: Diagnosis not present

## 2023-01-02 ENCOUNTER — Other Ambulatory Visit (HOSPITAL_COMMUNITY): Payer: Self-pay
